# Patient Record
Sex: Female | Born: 1940 | Race: Black or African American | Hispanic: No | Smoking: Former smoker
Health system: Southern US, Community
[De-identification: ages and names within clinical notes are randomized; demographics above are authoritative.]

## PROBLEM LIST (undated history)

## (undated) DIAGNOSIS — R410 Disorientation, unspecified: Secondary | ICD-10-CM

## (undated) DIAGNOSIS — M549 Dorsalgia, unspecified: Secondary | ICD-10-CM

## (undated) DIAGNOSIS — R531 Weakness: Secondary | ICD-10-CM

## (undated) DIAGNOSIS — E785 Hyperlipidemia, unspecified: Secondary | ICD-10-CM

## (undated) DIAGNOSIS — G5600 Carpal tunnel syndrome, unspecified upper limb: Secondary | ICD-10-CM

## (undated) DIAGNOSIS — K219 Gastro-esophageal reflux disease without esophagitis: Secondary | ICD-10-CM

## (undated) DIAGNOSIS — G4700 Insomnia, unspecified: Secondary | ICD-10-CM

## (undated) DIAGNOSIS — F419 Anxiety disorder, unspecified: Secondary | ICD-10-CM

## (undated) DIAGNOSIS — M1990 Unspecified osteoarthritis, unspecified site: Secondary | ICD-10-CM

## (undated) DIAGNOSIS — R351 Nocturia: Secondary | ICD-10-CM

## (undated) DIAGNOSIS — R51 Headache: Secondary | ICD-10-CM

## (undated) DIAGNOSIS — Z8709 Personal history of other diseases of the respiratory system: Secondary | ICD-10-CM

## (undated) DIAGNOSIS — I671 Cerebral aneurysm, nonruptured: Secondary | ICD-10-CM

## (undated) DIAGNOSIS — I480 Paroxysmal atrial fibrillation: Secondary | ICD-10-CM

## (undated) DIAGNOSIS — M2540 Effusion, unspecified joint: Secondary | ICD-10-CM

## (undated) DIAGNOSIS — R519 Headache, unspecified: Secondary | ICD-10-CM

## (undated) DIAGNOSIS — M2550 Pain in unspecified joint: Secondary | ICD-10-CM

## (undated) DIAGNOSIS — I10 Essential (primary) hypertension: Secondary | ICD-10-CM

## (undated) DIAGNOSIS — H269 Unspecified cataract: Secondary | ICD-10-CM

## (undated) DIAGNOSIS — IMO0001 Reserved for inherently not codable concepts without codable children: Secondary | ICD-10-CM

## (undated) DIAGNOSIS — R350 Frequency of micturition: Secondary | ICD-10-CM

## (undated) HISTORY — PX: JOINT REPLACEMENT: SHX530

---

## 2005-10-04 HISTORY — PX: BRAIN SURGERY: SHX531

## 2006-08-06 ENCOUNTER — Inpatient Hospital Stay (HOSPITAL_COMMUNITY): Admission: EM | Admit: 2006-08-06 | Discharge: 2006-08-08 | Payer: Self-pay | Admitting: *Deleted

## 2006-08-08 ENCOUNTER — Encounter (INDEPENDENT_AMBULATORY_CARE_PROVIDER_SITE_OTHER): Payer: Self-pay | Admitting: Interventional Cardiology

## 2006-11-05 ENCOUNTER — Emergency Department (HOSPITAL_COMMUNITY): Admission: EM | Admit: 2006-11-05 | Discharge: 2006-11-06 | Payer: Self-pay | Admitting: Emergency Medicine

## 2006-12-20 ENCOUNTER — Encounter: Admission: RE | Admit: 2006-12-20 | Discharge: 2006-12-20 | Payer: Self-pay | Admitting: Internal Medicine

## 2006-12-22 ENCOUNTER — Encounter: Admission: RE | Admit: 2006-12-22 | Discharge: 2006-12-22 | Payer: Self-pay | Admitting: Internal Medicine

## 2007-04-19 ENCOUNTER — Encounter: Admission: RE | Admit: 2007-04-19 | Discharge: 2007-04-19 | Payer: Self-pay | Admitting: Internal Medicine

## 2007-11-07 ENCOUNTER — Emergency Department (HOSPITAL_COMMUNITY): Admission: EM | Admit: 2007-11-07 | Discharge: 2007-11-07 | Payer: Self-pay | Admitting: Emergency Medicine

## 2008-01-18 ENCOUNTER — Encounter: Admission: RE | Admit: 2008-01-18 | Discharge: 2008-01-18 | Payer: Self-pay | Admitting: Orthopedic Surgery

## 2008-01-19 ENCOUNTER — Inpatient Hospital Stay (HOSPITAL_COMMUNITY): Admission: RE | Admit: 2008-01-19 | Discharge: 2008-01-25 | Payer: Self-pay | Admitting: Orthopedic Surgery

## 2008-01-22 ENCOUNTER — Encounter (INDEPENDENT_AMBULATORY_CARE_PROVIDER_SITE_OTHER): Payer: Self-pay | Admitting: Orthopedic Surgery

## 2008-01-22 ENCOUNTER — Ambulatory Visit: Payer: Self-pay | Admitting: Vascular Surgery

## 2008-01-23 ENCOUNTER — Ambulatory Visit: Payer: Self-pay | Admitting: Physical Medicine & Rehabilitation

## 2008-05-07 ENCOUNTER — Encounter: Admission: RE | Admit: 2008-05-07 | Discharge: 2008-06-25 | Payer: Self-pay | Admitting: Orthopedic Surgery

## 2008-07-19 ENCOUNTER — Encounter: Admission: RE | Admit: 2008-07-19 | Discharge: 2008-07-19 | Payer: Self-pay | Admitting: Internal Medicine

## 2009-04-03 HISTORY — PX: TRACHEOSTOMY: SUR1362

## 2009-04-11 ENCOUNTER — Encounter (INDEPENDENT_AMBULATORY_CARE_PROVIDER_SITE_OTHER): Payer: Self-pay | Admitting: Neurosurgery

## 2009-04-11 ENCOUNTER — Inpatient Hospital Stay (HOSPITAL_COMMUNITY): Admission: AC | Admit: 2009-04-11 | Discharge: 2009-05-12 | Payer: Self-pay

## 2009-04-12 ENCOUNTER — Ambulatory Visit: Payer: Self-pay | Admitting: Internal Medicine

## 2009-04-22 ENCOUNTER — Encounter: Payer: Self-pay | Admitting: Gastroenterology

## 2009-04-25 ENCOUNTER — Ambulatory Visit: Payer: Self-pay | Admitting: Physical Medicine & Rehabilitation

## 2009-05-01 HISTORY — PX: VENTRICULOPERITONEAL SHUNT: SHX204

## 2009-05-02 ENCOUNTER — Ambulatory Visit: Payer: Self-pay | Admitting: Internal Medicine

## 2009-05-04 ENCOUNTER — Ambulatory Visit: Payer: Self-pay | Admitting: Pulmonary Disease

## 2009-05-12 ENCOUNTER — Encounter: Payer: Self-pay | Admitting: Family Medicine

## 2009-05-12 DIAGNOSIS — I69959 Hemiplegia and hemiparesis following unspecified cerebrovascular disease affecting unspecified side: Secondary | ICD-10-CM | POA: Insufficient documentation

## 2009-05-12 DIAGNOSIS — I10 Essential (primary) hypertension: Secondary | ICD-10-CM | POA: Insufficient documentation

## 2009-05-12 DIAGNOSIS — D649 Anemia, unspecified: Secondary | ICD-10-CM | POA: Insufficient documentation

## 2009-05-12 DIAGNOSIS — E785 Hyperlipidemia, unspecified: Secondary | ICD-10-CM | POA: Insufficient documentation

## 2009-05-12 DIAGNOSIS — I619 Nontraumatic intracerebral hemorrhage, unspecified: Secondary | ICD-10-CM | POA: Insufficient documentation

## 2009-05-12 DIAGNOSIS — M810 Age-related osteoporosis without current pathological fracture: Secondary | ICD-10-CM | POA: Insufficient documentation

## 2009-05-12 DIAGNOSIS — M1990 Unspecified osteoarthritis, unspecified site: Secondary | ICD-10-CM | POA: Insufficient documentation

## 2009-05-13 ENCOUNTER — Telehealth: Payer: Self-pay | Admitting: Family Medicine

## 2009-05-13 ENCOUNTER — Telehealth: Payer: Self-pay | Admitting: Gastroenterology

## 2009-05-14 ENCOUNTER — Ambulatory Visit: Payer: Self-pay | Admitting: Family Medicine

## 2009-05-15 ENCOUNTER — Encounter: Payer: Self-pay | Admitting: Gastroenterology

## 2009-05-15 ENCOUNTER — Ambulatory Visit (HOSPITAL_COMMUNITY): Admission: RE | Admit: 2009-05-15 | Discharge: 2009-05-15 | Payer: Self-pay | Admitting: Gastroenterology

## 2009-05-21 ENCOUNTER — Ambulatory Visit: Payer: Self-pay | Admitting: Gastroenterology

## 2009-05-23 ENCOUNTER — Encounter: Payer: Self-pay | Admitting: Family Medicine

## 2009-05-26 ENCOUNTER — Encounter: Payer: Self-pay | Admitting: Pharmacist

## 2009-05-29 ENCOUNTER — Ambulatory Visit: Payer: Self-pay | Admitting: Family Medicine

## 2009-05-29 ENCOUNTER — Telehealth: Payer: Self-pay | Admitting: Internal Medicine

## 2009-06-04 ENCOUNTER — Telehealth: Payer: Self-pay | Admitting: Family Medicine

## 2009-06-06 ENCOUNTER — Encounter: Payer: Self-pay | Admitting: Family Medicine

## 2009-06-12 ENCOUNTER — Ambulatory Visit (HOSPITAL_COMMUNITY): Admission: RE | Admit: 2009-06-12 | Discharge: 2009-06-12 | Payer: Self-pay | Admitting: Family Medicine

## 2009-06-12 ENCOUNTER — Telehealth (INDEPENDENT_AMBULATORY_CARE_PROVIDER_SITE_OTHER): Payer: Self-pay | Admitting: *Deleted

## 2009-06-18 ENCOUNTER — Encounter: Payer: Self-pay | Admitting: Family Medicine

## 2009-06-18 ENCOUNTER — Ambulatory Visit: Payer: Self-pay | Admitting: Family Medicine

## 2009-06-20 ENCOUNTER — Encounter: Payer: Self-pay | Admitting: Pharmacist

## 2009-07-09 ENCOUNTER — Ambulatory Visit: Payer: Self-pay | Admitting: Internal Medicine

## 2009-07-10 ENCOUNTER — Telehealth: Payer: Self-pay | Admitting: Internal Medicine

## 2009-07-11 ENCOUNTER — Ambulatory Visit: Payer: Self-pay | Admitting: Internal Medicine

## 2009-07-14 ENCOUNTER — Encounter: Admission: RE | Admit: 2009-07-14 | Discharge: 2009-07-14 | Payer: Self-pay | Admitting: Neurosurgery

## 2009-07-16 ENCOUNTER — Encounter: Payer: Self-pay | Admitting: Family Medicine

## 2009-07-16 ENCOUNTER — Ambulatory Visit: Payer: Self-pay | Admitting: Family Medicine

## 2009-07-17 ENCOUNTER — Encounter: Admission: RE | Admit: 2009-07-17 | Discharge: 2009-07-17 | Payer: Self-pay | Admitting: Neurosurgery

## 2009-07-18 ENCOUNTER — Inpatient Hospital Stay (HOSPITAL_COMMUNITY): Admission: RE | Admit: 2009-07-18 | Discharge: 2009-07-22 | Payer: Self-pay | Admitting: Neurosurgery

## 2009-07-22 ENCOUNTER — Telehealth: Payer: Self-pay | Admitting: Family Medicine

## 2009-07-23 ENCOUNTER — Encounter: Payer: Self-pay | Admitting: Family Medicine

## 2009-07-28 ENCOUNTER — Encounter: Payer: Self-pay | Admitting: Family Medicine

## 2009-07-28 LAB — CONVERTED CEMR LAB
BUN: 5 mg/dL
Chloride: 97 meq/L
Creatinine, Ser: 0.39 mg/dL

## 2009-07-29 ENCOUNTER — Emergency Department (HOSPITAL_COMMUNITY): Admission: EM | Admit: 2009-07-29 | Discharge: 2009-07-29 | Payer: Self-pay | Admitting: Emergency Medicine

## 2009-08-15 ENCOUNTER — Encounter: Payer: Self-pay | Admitting: Family Medicine

## 2009-08-16 ENCOUNTER — Telehealth: Payer: Self-pay | Admitting: Family Medicine

## 2009-08-18 ENCOUNTER — Ambulatory Visit: Payer: Self-pay | Admitting: Internal Medicine

## 2009-08-20 ENCOUNTER — Encounter: Payer: Self-pay | Admitting: Family Medicine

## 2009-08-20 LAB — CONVERTED CEMR LAB
Hemoglobin: 10.3 g/dL
MCV: 83 fL
TSH: 1.11 microintl units/mL
WBC: 4.3 10*3/uL

## 2009-08-21 LAB — CONVERTED CEMR LAB
Basophils Absolute: 0.2 10*3/uL — ABNORMAL HIGH (ref 0.0–0.1)
Eosinophils Absolute: 0.1 10*3/uL (ref 0.0–0.7)
HCT: 35 % — ABNORMAL LOW (ref 36.0–46.0)
Lymphs Abs: 1.6 10*3/uL (ref 0.7–4.0)
MCHC: 33.2 g/dL (ref 30.0–36.0)
Monocytes Absolute: 0.5 10*3/uL (ref 0.1–1.0)
Monocytes Relative: 11.3 % (ref 3.0–12.0)
Platelets: 228 10*3/uL (ref 150.0–400.0)
RDW: 17.6 % — ABNORMAL HIGH (ref 11.5–14.6)
TSH: 1.17 microintl units/mL (ref 0.35–5.50)

## 2009-08-26 ENCOUNTER — Ambulatory Visit (HOSPITAL_COMMUNITY): Admission: RE | Admit: 2009-08-26 | Discharge: 2009-08-26 | Payer: Self-pay | Admitting: Pediatrics

## 2009-08-27 ENCOUNTER — Encounter: Payer: Self-pay | Admitting: Pharmacist

## 2009-08-27 ENCOUNTER — Telehealth: Payer: Self-pay | Admitting: Internal Medicine

## 2009-09-03 ENCOUNTER — Encounter: Payer: Self-pay | Admitting: Family Medicine

## 2009-09-10 ENCOUNTER — Encounter: Payer: Self-pay | Admitting: Pharmacist

## 2009-09-11 ENCOUNTER — Ambulatory Visit (HOSPITAL_COMMUNITY): Admission: RE | Admit: 2009-09-11 | Discharge: 2009-09-11 | Payer: Self-pay | Admitting: Pediatrics

## 2009-09-24 ENCOUNTER — Encounter (INDEPENDENT_AMBULATORY_CARE_PROVIDER_SITE_OTHER): Payer: Self-pay | Admitting: *Deleted

## 2009-09-30 ENCOUNTER — Encounter: Admission: RE | Admit: 2009-09-30 | Discharge: 2009-09-30 | Payer: Self-pay | Admitting: Neurosurgery

## 2009-10-08 ENCOUNTER — Encounter: Payer: Self-pay | Admitting: Family Medicine

## 2009-10-10 ENCOUNTER — Encounter: Payer: Self-pay | Admitting: Family Medicine

## 2009-10-10 LAB — CONVERTED CEMR LAB
BUN: 9 mg/dL
CO2: 21 meq/L
Calcium: 8.9 mg/dL
Creatinine, Ser: 0.41 mg/dL
Glucose, Bld: 84 mg/dL
Triglycerides: 189 mg/dL

## 2009-10-17 ENCOUNTER — Encounter: Payer: Self-pay | Admitting: Family Medicine

## 2009-10-21 ENCOUNTER — Encounter: Payer: Self-pay | Admitting: Family Medicine

## 2009-10-21 ENCOUNTER — Telehealth: Payer: Self-pay | Admitting: *Deleted

## 2009-10-22 ENCOUNTER — Encounter: Payer: Self-pay | Admitting: Pharmacist

## 2009-10-27 ENCOUNTER — Ambulatory Visit: Payer: Self-pay | Admitting: Gastroenterology

## 2009-10-29 ENCOUNTER — Encounter: Payer: Self-pay | Admitting: Pharmacist

## 2009-11-05 ENCOUNTER — Encounter: Payer: Self-pay | Admitting: Pharmacist

## 2009-11-11 ENCOUNTER — Telehealth: Payer: Self-pay | Admitting: Family Medicine

## 2009-11-12 ENCOUNTER — Encounter: Payer: Self-pay | Admitting: Family Medicine

## 2009-11-12 ENCOUNTER — Ambulatory Visit: Payer: Self-pay | Admitting: Family Medicine

## 2009-11-20 ENCOUNTER — Telehealth: Payer: Self-pay | Admitting: Family Medicine

## 2009-11-27 ENCOUNTER — Encounter: Payer: Self-pay | Admitting: Family Medicine

## 2009-12-03 ENCOUNTER — Encounter: Payer: Self-pay | Admitting: Family Medicine

## 2009-12-04 ENCOUNTER — Encounter: Payer: Self-pay | Admitting: Family Medicine

## 2009-12-09 ENCOUNTER — Encounter: Payer: Self-pay | Admitting: Family Medicine

## 2009-12-15 ENCOUNTER — Telehealth: Payer: Self-pay | Admitting: *Deleted

## 2009-12-15 ENCOUNTER — Encounter: Payer: Self-pay | Admitting: Family Medicine

## 2009-12-26 ENCOUNTER — Ambulatory Visit: Payer: Self-pay | Admitting: Family Medicine

## 2010-08-03 IMAGING — CT CT CHEST W/O CM
2 of 3 series · 15 of 36 positions shown, 18 images · non-contrast
Comparison: 01/23/2008 and 08/05/2006..

CLINICAL DATA: Follow-up pulmonary nodules.  Ex-smoker.

CT CHEST WITHOUT CONTRAST
TECHNIQUE: Multidetector CT imaging of the chest was performed
following the standard protocol without IV contrast.

[Series 3: routine chest · axial · 0.70mm/px · z∈[-286,-56]mm · 12 of 54 slices shown, 15 images]
[im 4/54  mediastinal]
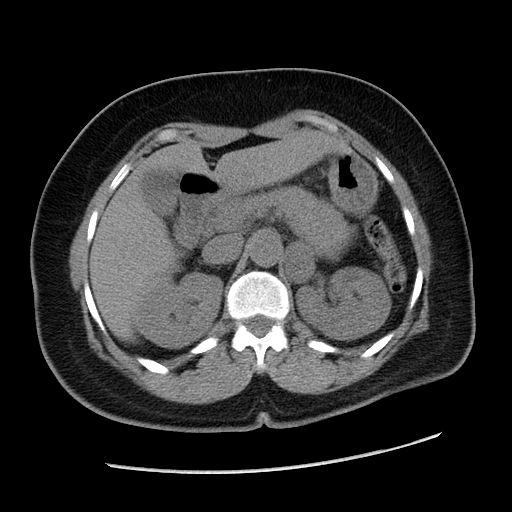
[im 4/54  lung]
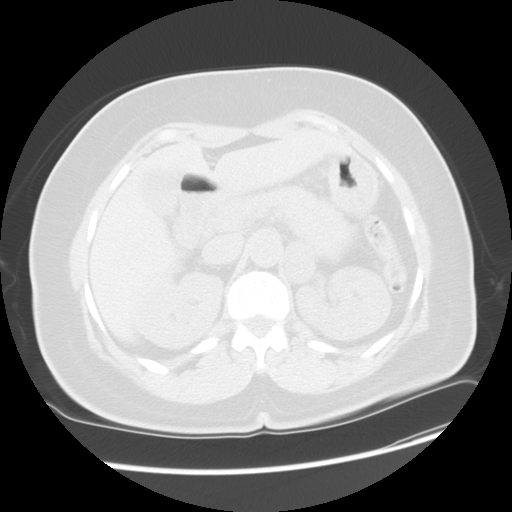
[im 8/54  lung]
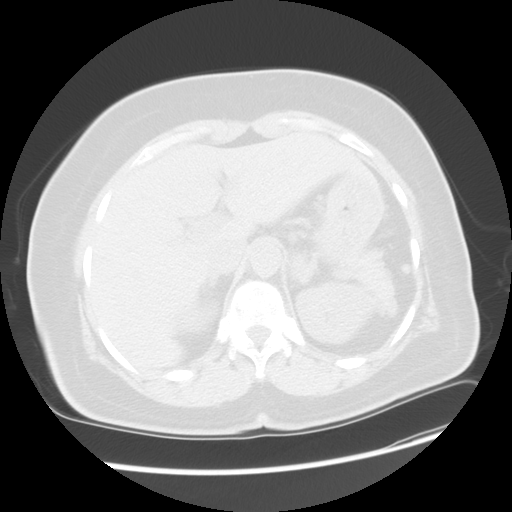
[im 12/54  lung]
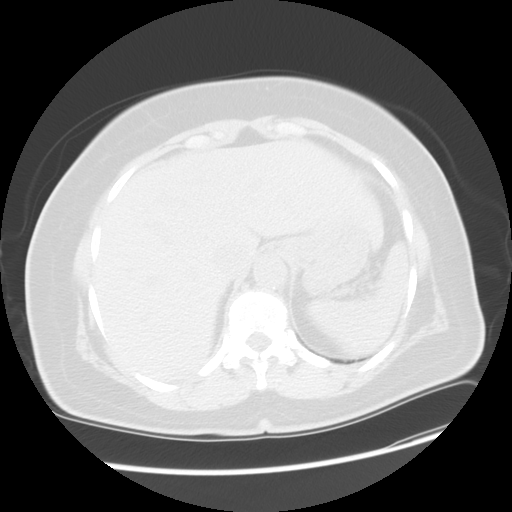
[im 16/54  lung]
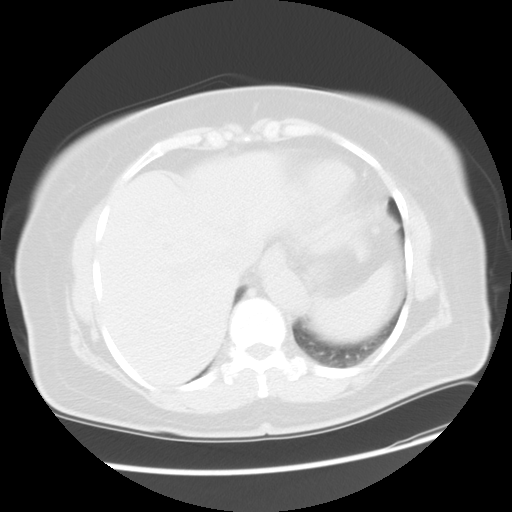
[im 20/54  mediastinal]
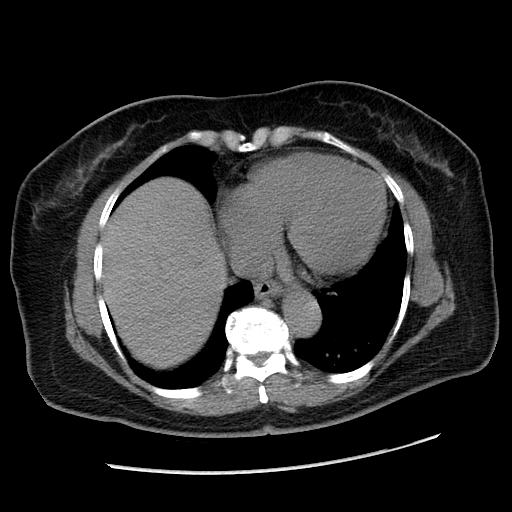
[im 20/54  lung]
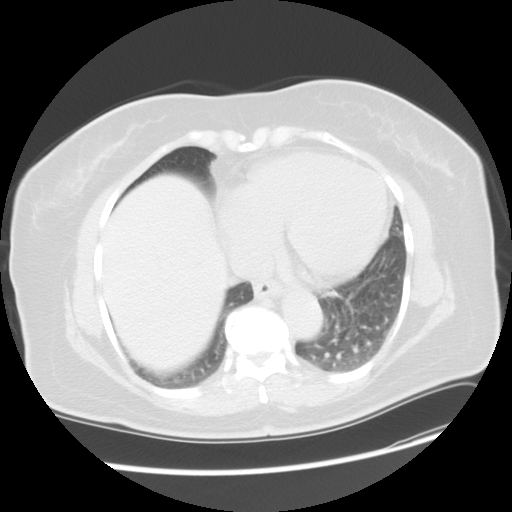
[im 24/54  lung]
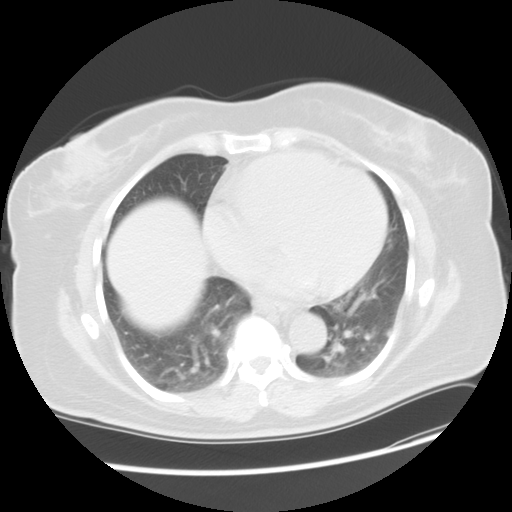
[im 30/54  lung]
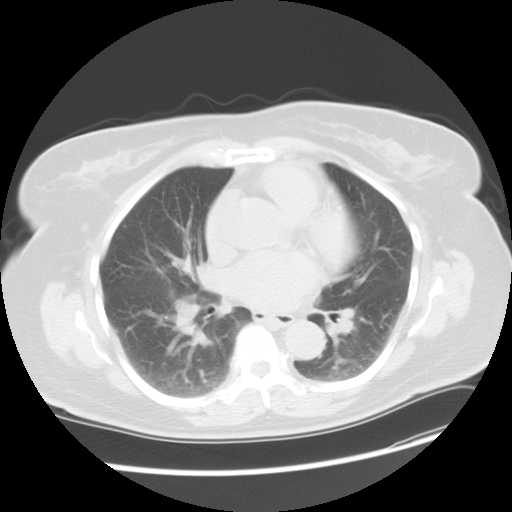
[im 34/54  lung]
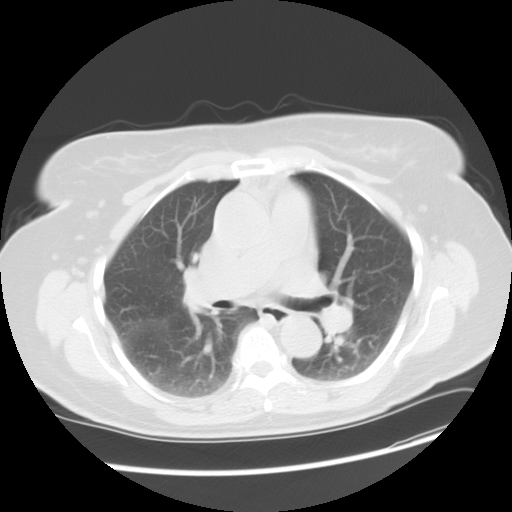
[im 38/54  mediastinal]
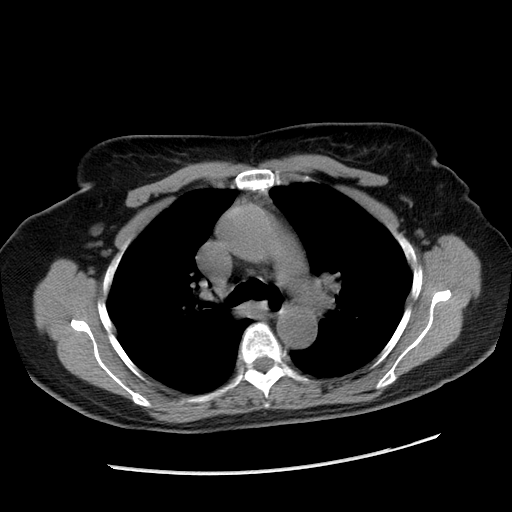
[im 38/54  lung]
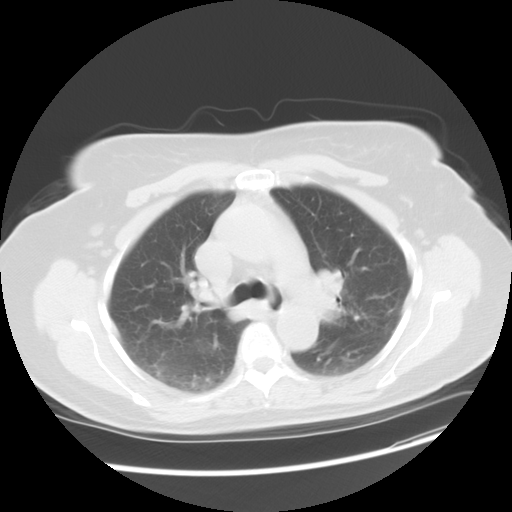
[im 42/54  lung]
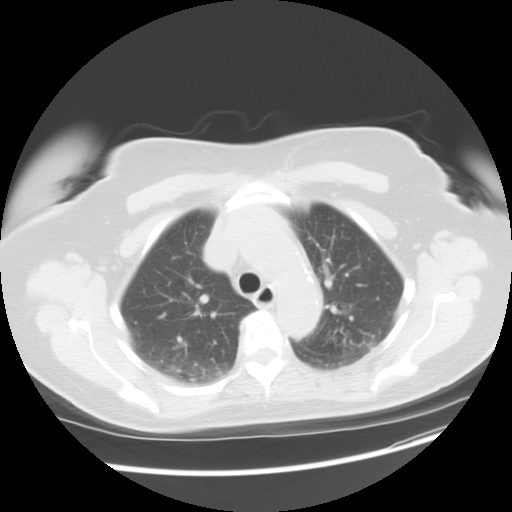
[im 46/54  lung]
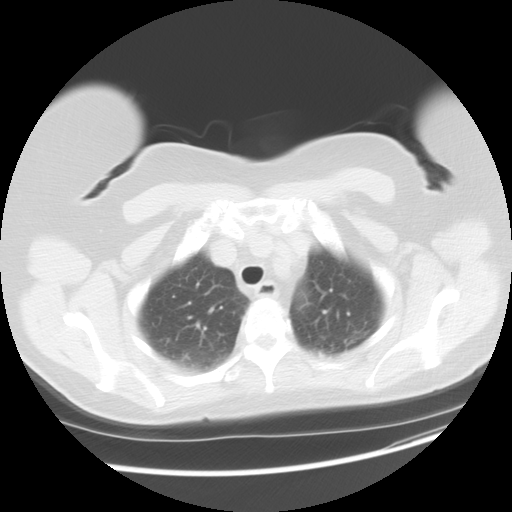
[im 50/54  lung]
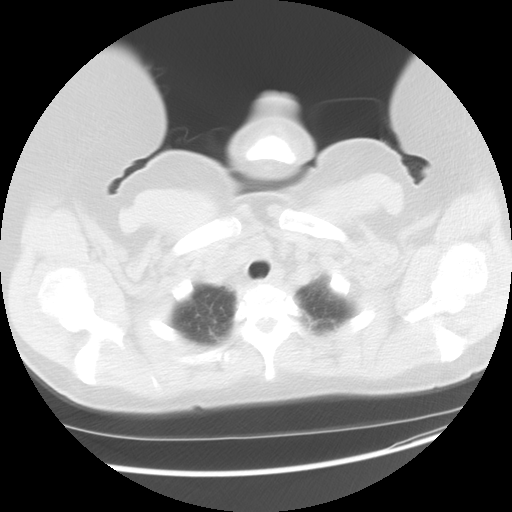

[Series 602: sagittal body · sagittal · 0.70mm/px · 3 of 145 slices shown]
[im 29/145  lung]
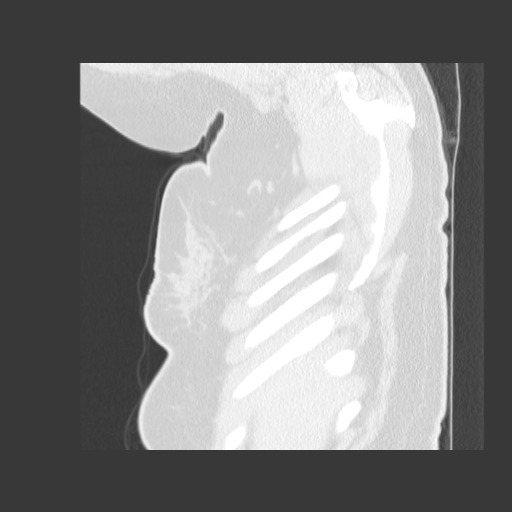
[im 58/145  lung]
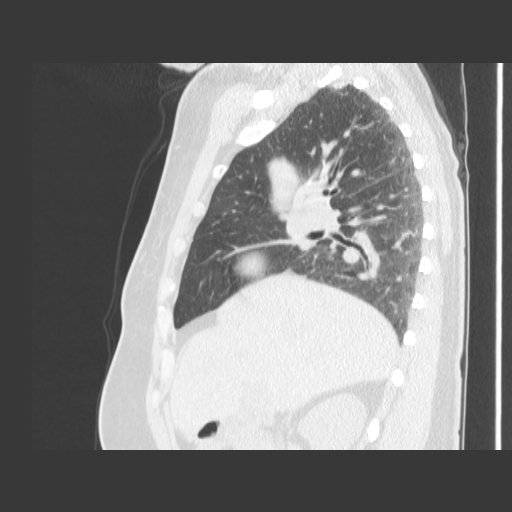
[im 87/145  lung]
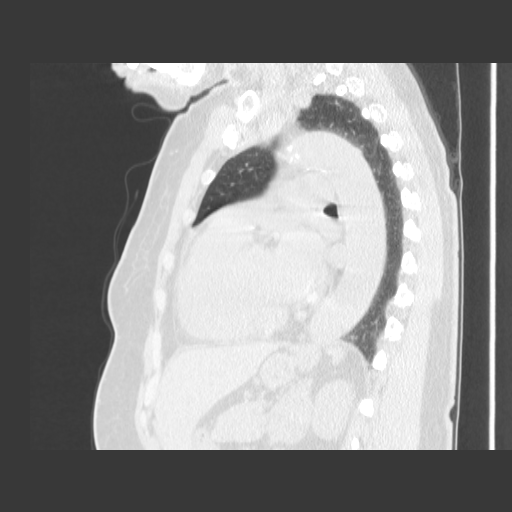

[15 of 36 positions shown; findings below may reference images not displayed]

FINDINGS: There is scattered tiny pulmonary nodules which appear
the same or slightly smaller than on the prior exam.  This
includes:  Right middle lobe 3 mm nodule (series 4 image 25),
mm pleural based nodule right lung base (series 4 image 26), 5.5 mm
partially calcified pleural-based right middle lobe nodule (series
4 image 30), 3.8 mm pleural based nodule left lung base (series 4
image 32) and tiny nodules side-by-side inferior aspect left lung
base (series 4 image 38).  One more follow-up CT scan without
contrast in 12 months recommended to confirm stability.

Similar appearance of subcarinal 1.6 x 1 cm lymph node.  No
significant change what appears to be a left adrenal myelolipoma
measuring 3.1 x 2.6 cm.  Ectasia and calcifications thoracic aorta
with ascending thoracic aorta appears slightly prominent with
maximal transverse dimension of 3.7 cm.  Heart size top normal to
minimally enlarged.
IMPRESSION: Pulmonary nodules appear the same or slightly smaller than on the
prior exam.  This slight difference may be related to the fact that
thin section imaging was utilized on prior exam (pulmonary embolus
technique).  One can help confirm stability with one more follow-up
chest CT in 12 months.

Atherosclerotic type changes aorta with mild dilation ascending
aorta with maximal transverse dimension 3.7 cm appears unchanged.

No significant change left adrenal lesion which may represent a
myelolipoma.

No significant change top normal size subcarinal lymph node.

## 2010-09-24 ENCOUNTER — Encounter: Payer: Self-pay | Admitting: Family Medicine

## 2010-11-03 NOTE — Letter (Signed)
Summary: Generic Letter  [facility]  [address]   [place], [place] [address]   Phone: [phone]  Fax: [phone]    12/15/2009  Sharica Oettinger [address] [place], [place]  [address]  Dear Ms. Maisie Fus,  To whom it may concern,   I am one of the providers who took care of Mrs Deepti Gunawan while she was at [facility].  She is competent to make decisions concerning her care.  For her psychological testing and psychiatric evaluation, please contact [facility] at [phone].  Please feel free to contact my office if you have any questions.   Sincerely,   Eustaquio Boyden  MD

## 2010-11-03 NOTE — Assessment & Plan Note (Signed)
Summary: DISCUSS REMOVING PEG TUBE/YF   History of Present Illness Visit Type: consult  Primary GI MD: Melvia Heaps MD [facility] Primary Provider: Zachery Dauer, MD  Requesting Provider: Aliene Beams, MD  Chief Complaint: Discuss removing peg tube  History of Present Illness:   Mr. Banbury has returned to have her gastrostomy tube removed.  She is eating adequate amounts orally and no longer uses the 2.  The gastrostomy tube was placed in August, 2010.   GI Review of Systems      Denies abdominal pain, acid reflux, belching, bloating, chest pain, dysphagia with liquids, dysphagia with solids, heartburn, loss of appetite, nausea, vomiting, vomiting blood, weight loss, and  weight gain.        Denies anal fissure, black tarry stools, change in bowel habit, constipation, diarrhea, diverticulosis, fecal incontinence, heme positive stool, hemorrhoids, irritable bowel syndrome, jaundice, light color stool, liver problems, rectal bleeding, and  rectal pain.    Current Medications (verified): 1)  Norvasc 10 Mg Tabs (Amlodipine Besylate) .... One Daily 2)  Protonix 40 Mg Tbec (Pantoprazole Sodium) .... Once Daily 3)  Keppra 500 Mg Tabs (Levetiracetam) .... One Per Gtube Two Times A Day 4)  Colace 100 Mg Caps (Docusate Sodium) .... One By Mouth Two Times A Day Per Gtube, Hold For Loose Stools 5)  Free Water Flushes .... 240cc Per Gt Q4 Hours 6)  Ssi .... Per Protocol, Sensitive, With Cbgs Qac Hs 7)  Doxazosin Mesylate 1 Mg Tabs (Doxazosin Mesylate) .... 2 Tabs Daily 8)  Lorazepam 2 Mg/ml Soln (Lorazepam) .... 1mg  [place]  As Needed Seizures 9)  Celexa 20 Mg Tabs (Citalopram Hydrobromide) .... One Tab By Mouth Daily 10)  Sodium Chloride 1 Gm Tabs (Sodium Chloride) .... Two Daily 11)  Naprosyn 500 Mg Tabs (Naproxen) .... Take One By Mouth Two Times A Day As Needed Knee Pain 12)  Klor-Con 20 Meq Pack (Potassium Chloride) .... Once Daily 13)  Hydrocodone-Acetaminophen 7.5-500 Mg Tabs  (Hydrocodone-Acetaminophen) .Marland Kitchen.. 1 Q4 Prn 14)  Celexa 10 Mg Tabs (Citalopram Hydrobromide) .... One Tablet By Mouth Once Daily  Allergies (verified): No Known Drug Allergies  Past History:  Past Medical History: Reviewed history from 06/06/2009 and no changes required. 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 Craniotomy with hematoma evacuation and AVM resection 04/11/2009 Tracheostomy 04/18/2009 PEG placement 04/2009 VP Shunt placement 05/01/2009 VP shunt revision October 2010 L total knee replacement 2009  Family History: Reviewed history from 05/12/2009 and no changes required. Mother - DM Father - Prosate CA 1 sister with brain aneurysm HTN in family  Social History: Reviewed history from 10/08/2009 and no changes required. Currently resident at [facility].  This patient lives in the [place], [place], area.  She is not married, has 3 children who are alive and well.  The patient has not worked.  No EtOH, smoking.  Daughter Fynlee Rowlands.  Review of Systems  The patient denies allergy/sinus, anemia, anxiety-new, arthritis/joint pain, back pain, blood in urine, breast changes/lumps, change in vision, confusion, cough, coughing up blood, depression-new, fainting, fatigue, fever, headaches-new, hearing problems, heart murmur, heart rhythm changes, itching, menstrual pain, muscle pains/cramps, night sweats, nosebleeds, pregnancy symptoms, shortness of breath, skin rash, sleeping problems, sore throat, swelling of feet/legs, swollen lymph glands, thirst - excessive ,  urination - excessive , urination changes/pain, urine leakage, vision changes, and voice change.    Vital  Signs:  Patient profile:   70 year old female Height:      68 inches Pulse rate:   88 / minute Pulse rhythm:   regular BP sitting:   100 / 68  (left arm) Cuff size:   regular  Vitals Entered By: Ok Anis CMA (October 27, 2009 1:27 PM)  Physical Exam  Additional Exam:  On exam she is an elderly female  The gastrostomy tube was pulled percutaneously.   Impression & Recommendations:  Problem # 1:  GASTROSTOMY STATUS (ICD-V44.1) Patient's gastrostomy tube was removed percutaneously.  Oral intake is adequate.  Weight was not documented today as patient unable to stand; BMI could not be calculated.

## 2010-11-03 NOTE — Miscellaneous (Signed)
  Clinical Lists Changes  Medications: Removed medication of DOXAZOSIN MESYLATE 2 MG TABS (DOXAZOSIN MESYLATE) once daily Removed medication of KLOR-CON 20 MEQ PACK (POTASSIUM CHLORIDE) once daily Changed medication from COLACE 100 MG CAPS (DOCUSATE SODIUM) one by mouth two times a day per GTube, hold for loose stools to COLACE 100 MG CAPS (DOCUSATE SODIUM) one by mouth two times a day, hold for loose stools Changed medication from ULTRACET 37.5-325 MG TABS (TRAMADOL-ACETAMINOPHEN) 30 min prior to PT and afternoon and hs to ULTRACET 37.5-325 MG TABS (TRAMADOL-ACETAMINOPHEN) 2 tabs po 30 min prior to PT and 1 tab po bid Changed medication from KEPPRA 500 MG TABS (LEVETIRACETAM) one per Gtube two times a day to KEPPRA 500 MG TABS (LEVETIRACETAM) one po two times a day

## 2010-11-03 NOTE — Assessment & Plan Note (Signed)
Summary: for FL-2 form,tcb   Vital Signs:  Patient profile:   70 year old female Height:      68 inches Temp:     97.5 degrees F oral Pulse rate:   73 / minute BP sitting:   133 / 85  (left arm) Cuff size:   regular  Vitals Entered By: Gina Parker CMA (December 26, 2009 2:57 PM)  Primary Care Provider:  Zachery Dauer, MD    History of Present Illness: CC: ? fill out FL2  Previously PCP was Dr. Nehemiah Parker at [facility] with Gina Parker, family friend.  Here to discuss placement.  Living with brother at [facility].  Gina Parker is unable to walk or take care of herself from the waist down, brother unable to.  Needs more help.  Filling out FL2 today - has had 3-4 falls since at home.  Incontinence with bowel and bladder.  Still having R and L knee pain.  (h/o L knee replacement).  Previously on ultracet, then d/c and changed to Tylenol.  On Keppra after AVM rupture and VP shunt placement, but no documentation of ever having seizure during hospitalization.  Also per patient and Gina Parker, has bed sore since went home (noticed 2 saturdays ago).  Gina Parker says that bed sore was there and treated since [facility] (3/8).  Appetite not what it should be, having difficulty sleeping.  Notes having difficulty remembering things.  Current Medications (verified): 1)  Norvasc 10 Mg Tabs (Amlodipine Besylate) .... One Daily 2)  Protonix 40 Mg Tbec (Pantoprazole Sodium) .... Once Daily 3)  Keppra 500 Mg Tabs (Levetiracetam) .... One Po Two Times A Day 4)  Colace 100 Mg Caps (Docusate Sodium) .... One By Mouth Two Times A Day, Hold For Loose Stools 5)  Celexa 20 Mg Tabs (Citalopram Hydrobromide) .... One Tab By Mouth Daily 6)  Minerin  Crea (Skin Protectants, Misc.) .... Apply To Lower Extremites Twice Daily 7)  Ultracet 37.5-325 Mg Tabs (Tramadol-Acetaminophen) .... Take One To Two Two Times A Day As Needed Pain 8)  Aricept 5 Mg Tabs (Donepezil Hcl) .... Take One By Mouth Daily  Allergies  (verified): No Known Drug Allergies  Past History:  Past medical, surgical, family and social histories (including risk factors) reviewed for relevance to current acute and chronic problems.  Past Medical History: Reviewed history from 12/04/2009 and no changes required. 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 Craniotomy with hematoma evacuation and AVM resection 04/11/2009 Tracheostomy 04/18/2009 decannulated 09/2009 PEG placement 04/2009 removal 10/2009 VP Shunt placement 05/01/2009 VP shunt revision October 2010 L total knee replacement 2009  Family History: Reviewed history from 05/12/2009 and no changes required. Mother - DM Father - Prosate CA 1 sister with brain aneurysm HTN in family  Social History: Reviewed history from 12/04/2009 and no changes required. Currently resident at [facility].  This patient lives in the [place], area.  She is not married, has 3 children who are alive and well.  The patient has not worked.  No EtOH, smoking.  Daughter Gina Parker.  plans to leave nursing home 12/09/2009  Physical Exam  General:  Alert, interactive and appropriate.   Lungs:  Normal respiratory effort, chest expands symmetrically. Lungs are clear to auscultation, no crackles or  wheezes. Heart:  Normal rate and regular rhythm. S1 and S2 normal without gallop, murmur, click, rub or other extra sounds. Abdomen:  soft, non tender, no masses, no guarding, no distention. 6 cm horizontal lower abdomen surg scar Skin:   ~3 cm x 3cm hypopigmented post inflammatory macule left buttock, no break in skin.   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  evidence of dementia.  Have started Aricept, discussed common side effects to include gi upset.  Needs more help than brother able to provide at home.  have filled out FL2 form for placement.  Wil likely need SNF care and assistance with personal care.  Problem # 2:  HYPERTENSION (ICD-401.9) controlled on norvasc. Her updated medication list for this problem includes:    Norvasc 10 Mg Tabs (Amlodipine besylate) ..... One daily  BP today: 133/85 Prior BP: 114/68 (12/04/2009)  Labs Reviewed: K+: 3.8 (10/10/2009) Creat: : 0.41 (10/10/2009)   HDL: 43 (10/10/2009)   LDL: 107 (10/10/2009)   TG: 189 (10/10/2009)  Problem # 3:  DEGENERATIVE JOINT DISEASE (ICD-715.90)  Her updated medication list for this problem includes:    Ultracet 37.5-325 Mg Tabs (Tramadol-acetaminophen) .Marland Kitchen... Take one to two two times a day as needed pain  Discussed use of medications, application of heat or cold, and exercises.  restarted ultracet.  doubt patient has seizures, could consider d/c keppra.  Problem # 4:  Hx of ARTERIOVENOUS MALFORMATION S/P RESECTION (ICD-747.60)  Complete Medication List: 1)  Norvasc 10 Mg Tabs (Amlodipine besylate) .... One daily 2)  Protonix 40 Mg Tbec (Pantoprazole sodium) .... Once daily 3)  Keppra 500 Mg Tabs (Levetiracetam) .... One po two times a day 4)  Colace 100 Mg Caps (Docusate sodium) .... One by mouth two times a day, hold for loose stools 5)  Celexa 20 Mg Tabs (Citalopram hydrobromide) .... One tab by mouth daily 6)  Minerin Crea (Skin protectants, misc.) .... Apply to lower extremites twice daily 7)  Ultracet 37.5-325 Mg Tabs (Tramadol-acetaminophen) .... Take one to two two times a day as needed pain 8)  Aricept 5 Mg Tabs (Donepezil hcl) .... Take one by mouth daily  Patient Instructions: 1)  Please return in 1 1/2 wks. 2)  We will start 2 medicines today: Aricept daily 5mg , Ultracet twice daily as needed for pain.  (Stop tylenol). 3)   I have filled out FL-2 form.  Let me know how we are doing with FL2 and placement. Prescriptions: ARICEPT 5 MG TABS (DONEPEZIL HCL) take one by mouth daily  #30 x 1   Entered and Authorized by:   Eustaquio Boyden  MD   Signed by:   Eustaquio Boyden  MD on 12/26/2009   Method used:   Electronically to        [facility]  [address] [phone]* (retail)       [address] [place] [address]       [place], [place]  [address]       Ph: [phone] or [phone]       Fax: [phone]   RxID:   [id number] ULTRACET 37.5-325 MG TABS (TRAMADOL-ACETAMINOPHEN) take one to two two times a day as needed pain  #45 x 0   Entered and Authorized by:   Eustaquio Boyden  MD   Signed by:   Eustaquio Boyden  MD on 12/26/2009   Method used:   Electronically to  [facility]  [address] [phone]* (retail)       [address] [place]       [place], [place]  [address]       Ph: [phone] or [phone]       Fax: [phone]   RxID:   [id number]      Geriatric Assessment:  Activities of Daily Living:    Bathing-dependent    Dressing-dependent    Eating-independent    Toileting-dependent    Transferring-dependent    Continence-dependent Overall Assessment: dependent  Instrumental Activities of Daily Living:    Transportation-dependent    Meal/Food Preparation-dependent    Shopping Errands-dependent    Housekeeping/Chores-dependent    Money Management/Finances-dependent    Medication Management-assisted    Ability to Use Telephone-assisted    Laundry-dependent Overall Assessment: dependent  Mental Status Exam: (value/max value)    Orientation to Time: 3/5    Orientation to Place: 5/5    Registration: 3/3    Attention/Calculation: 4/5    Recall: 1/3    Language-name 2 objects: 2/2    Language-repeat: 0/1    Language-follow 3-step command: 3/3    Language-read and follow direction: 1/1    Write a sentence: 1/1    Copy design: 0/1 MSE Total score:  23/30    Prevention & Chronic Care Immunizations   Influenza vaccine: Fluvax 3+  (07/09/2009)   Influenza vaccine due: 06/04/2010    Tetanus booster: Not documented    Pneumococcal vaccine: given  (05/20/2009)   Pneumococcal vaccine deferral: Not indicated  (12/26/2009)    H. zoster vaccine: Not documented  Colorectal Screening   Hemoccult: Not documented    Colonoscopy: Not documented  Other Screening   Pap smear: Not documented   Pap smear action/deferral: Not indicated-other  (08/15/2009)    Mammogram: Not documented    DXA bone density scan: Not documented   DXA bone density action/deferral: Not indicated  (08/15/2009)   Smoking status: never  (10/08/2009)  Lipids   Total Cholesterol: Not documented   LDL: 107  (10/10/2009)   LDL Direct: Not documented   HDL: 43  (10/10/2009)   Triglycerides: 189  (10/10/2009)    SGOT (AST): Not documented   SGPT (ALT): Not documented   Alkaline phosphatase: Not documented   Total bilirubin: Not documented    Lipid flowsheet reviewed?: Yes   Progress toward LDL goal: Unchanged  Hypertension   Last Blood Pressure: 133 / 85  (12/26/2009)   Serum creatinine: 0.41  (10/10/2009)   Serum potassium 3.8  (10/10/2009)    Hypertension flowsheet reviewed?: Yes   Progress toward BP goal: At goal  Self-Management Support :   Personal Goals (by the next clinic visit) :      Personal blood pressure goal: 130/80  (07/23/2009)     Personal LDL goal: 100  (08/15/2009)    Hypertension self-management support: Not documented    Hypertension self-management support not done because: Good outcomes  (12/05/2009)    Lipid self-management support: Not documented     Appended Document: Orders Update    Clinical Lists Changes  Orders: Added new Test order of [facility]  Level 4 ([address]644) - Signed

## 2010-11-03 NOTE — Progress Notes (Signed)
Summary: phn msg  Phone Note Call from Patient Call back at Home Phone [phone]   Caller: Daughter-Lisa Maisie Fus Summary of Call: Dr Sheffield Slider is supposed to write a letter stating that she will be at [facility] for Short stay-  [address] [place], [place] [address] Initial call taken by: De Nurse,  November 20, 2009 2:37 PM  Follow-up for Phone Call        That's the letter that is already  in the documents and was sent to Admin to be printed Follow-up by: Zachery Dauer MD,  November 20, 2009 2:49 PM

## 2010-11-03 NOTE — Miscellaneous (Signed)
Summary: Tube feeding discontinued  Clinical Lists Changes  Observations: Added new observation of LDL: 107 mg/dL (29/56/2130 86:57) Added new observation of HDL: 43 mg/dL (84/69/6295 28:41) Added new observation of TRIGLYC TOT: 189 mg/dL (32/44/0102 72:53) Added new observation of CALCIUM: 8.9 mg/dL (66/44/0347 42:59) Added new observation of CREATININE: 0.41 mg/dL (56/38/7564 33:29) Added new observation of BUN: 9 mg/dL (51/88/4166 06:30) Added new observation of BG RANDOM: 84 mg/dL (16/10/930 35:57) Added new observation of CO2 PLSM/SER: 21 meq/L (10/10/2009 10:20) Added new observation of CL SERUM: 105 meq/L (10/10/2009 10:20) Added new observation of K SERUM: 3.8 meq/L (10/10/2009 10:20) Added new observation of NA: 142 meq/L (10/10/2009 10:20)

## 2010-11-03 NOTE — Miscellaneous (Signed)
Summary: PCP VISIT/Discharge Summary   Vital Signs:  Patient profile:   70 year old female Temp:     97.4 degrees F Pulse rate:   70 / minute Resp:     20 per minute BP supine:   114 / 68  Primary Care Provider:  Zachery Dauer, MD    History of Present Illness: CC: nursing home visit.  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  Pt with PEG Gina Parker) removed 10/2009 and Trache (Gina Parker) decannulated 09/2009.  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    Gina Parker is doing great today.  She continues to endorse knee pain bilaterally, but L > R (L side is where she had knee replacement surgery last year) worse when participating in PT, has been using ultracet prior to PT with good relief.  Xray report with evidence of OA bilaterally.    Currently on celexa 20mg  for depression and doing well on this.  Also on Keppra after seizure x 1 from event.  Current Medications (verified): 1)  Norvasc 10 Mg Tabs (Amlodipine Besylate) .... One Daily 2)  Protonix 40 Mg Tbec (Pantoprazole Sodium) .... Once Daily 3)  Keppra 500 Mg Tabs (Levetiracetam) .... One Po Two Times A Day 4)  Colace 100 Mg Caps (Docusate Sodium) .... One By Mouth Two Times A Day, Hold For Loose Stools 5)  Lorazepam 2 Mg/ml Soln (Lorazepam) .... 1mg  [place]  As Needed Seizures 6)  Celexa 20 Mg Tabs (Citalopram Hydrobromide) .... One Tab By Mouth Daily 7)  Minerin  Crea (Skin Protectants, Misc.) .... Apply To Lower Extremites Twice Daily 8)  Ultracet 37.5-325 Mg Tabs (Tramadol-Acetaminophen) .... 2 Tabs Po 30 Min Prior To Pt and 1 Tab Po Bid  Allergies (verified): No Known Drug Allergies  Past History:  Past medical, surgical, family and social histories (including risk factors) reviewed for relevance to current acute and chronic problems.  Past Medical History: Proteus UTI  sensitive to Keflex (05/2009) with urinary retention L frontal hemorrhage from AVM s/p craniotomy and resection Communicating Hydrocephalus s/p VP shunt h/o trache and PEG Anemia Hyponatremia HTN HLD OA Osteoporsis Obesity  Past Surgical History: Craniotomy with hematoma evacuation and AVM resection 04/11/2009 Tracheostomy 04/18/2009 decannulated 09/2009 PEG placement 04/2009 removal 10/2009 VP Shunt placement 05/01/2009 VP shunt revision October 2010 L total knee replacement 2009  Family History: Reviewed history from 05/12/2009 and no changes required. Mother - DM Father - Prosate CA 1 sister with brain aneurysm HTN in family  Social History: Reviewed history from 10/08/2009 and no changes required. Currently resident at [facility].  This patient lives in the [place], [place], area.  She is not married, has 3 children who are alive and well.  The patient has not worked.  No EtOH, smoking.  Daughter Gina Parker.  plans to leave nursing home 12/09/2009  Physical Exam  General:  Alert, interactive and appropriate.   Head:  left frontoparietal surgical wound healed.  left parietal wound healing well with underlying tubular structure.  hair has regrown well Lungs:  Normal respiratory effort, chest expands symmetrically. Lungs are clear to auscultation, no crackles or wheezes. Heart:  Normal rate and regular rhythm. S1 and S2 normal without gallop, murmur, click, rub or other extra sounds. Abdomen:  soft, non tender, no masses, no guarding, no distention. 6 cm horizontal lower abdomen surg scar without  signs of infection. Msk:  vertical well healed surgical scar left anterior knee Pulses:  2+ periph pulses Extremities:  1+ left pedal edema and 1+ right pedal edema.     Impression & Recommendations:  Problem # 1:  ANEMIA (ICD-285.9)  Hgb slowly increasing.  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  have asked CM to help clarify discharge planning (ie where Gina Parker will be living, she is currently unsure about this - says either with brother in [place] or with sister in [place]).  regardless, will need assistance with transfers as remains non-ambulatory.  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  (Dr. Anne Parker started AED upon admission to hospital, pt and family deny sz during hospialization or previously).  I wonder if she could be taken off Keppra as has not had any more seizure activity since being placed on this.  Problem # 4:  HYPERTENSION (ICD-401.9) stable only on norvasc.  could consider titrating down to 5mg  to see if remains controlled ( have take her off other antihypertensives). Her updated medication list for this problem includes:    Norvasc 10 Mg Tabs (Amlodipine besylate) ..... One daily  Problem # 5:  DEGENERATIVE JOINT DISEASE (ICD-715.90)  continue ultracet.   The following medications were removed from the medication list:    Naprosyn 500 Mg Tabs (Naproxen) .Marland Kitchen... Take one by mouth two times a day as needed knee pain Her updated medication list for this problem includes:    Ultracet 37.5-325 Mg Tabs (Tramadol-acetaminophen) .Marland Kitchen... 2 tabs po 30 min prior to pt and 1 tab po bid  Complete Medication List: 1)  Norvasc 10 Mg Tabs (Amlodipine besylate) .... One daily 2)  Protonix 40 Mg Tbec (Pantoprazole sodium) .... Once daily 3)  Keppra 500 Mg Tabs (Levetiracetam) .... One po two times a day 4)  Colace 100 Mg Caps (Docusate sodium) .... One by mouth two times a day, hold for loose stools 5)  Lorazepam 2 Mg/ml Soln (Lorazepam) .... 1mg  [place]  as needed seizures 6)  Celexa 20 Mg Tabs (Citalopram hydrobromide) .... One tab by mouth daily 7)  Minerin Crea (Skin protectants, misc.) .... Apply to lower extremites twice daily 8)  Ultracet 37.5-325 Mg Tabs (Tramadol-acetaminophen) .... 2  tabs po 30 min prior to pt and 1 tab po bid  Appended Document: PCP VISIT correction to HPI - no documented seizure activity ever.  Appended Document: PCP VISIT    Clinical Lists Changes  Observations: Added new observation of HTNSMSUPPACT: Good outcomes (12/05/2009 8:09) Added new observation of HTN PROGRESS: At goal (12/05/2009 8:09) Added new observation of HTN FSREVIEW: Yes (12/05/2009 8:09) Added new observation of LIPID PROGRS: Unchanged (12/05/2009 8:09) Added new observation of LIPID FSREVW: Yes (12/05/2009 8:09) Added new observation of DM PROGRESS: N/A (12/05/2009 8:09) Added new observation of DM FSREVIEW: N/A (12/05/2009 8:09)       Prevention & Chronic Care Immunizations   Influenza vaccine: Fluvax 3+  (07/09/2009)   Influenza vaccine due: 06/04/2010    Tetanus booster: Not documented    Pneumococcal vaccine: given  (05/20/2009)    H. zoster vaccine: Not documented  Colorectal Screening   Hemoccult: Not documented    Colonoscopy: Not documented  Other Screening   Pap smear: Not documented   Pap smear action/deferral: Not indicated-other  (08/15/2009)    Mammogram: Not documented  DXA bone density scan: Not documented   DXA bone density action/deferral: Not indicated  (08/15/2009)   Smoking status: never  (10/08/2009)  Lipids   Total Cholesterol: Not documented   LDL: 107  (10/10/2009)   LDL Direct: Not documented   HDL: 43  (10/10/2009)   Triglycerides: 189  (10/10/2009)    SGOT (AST): Not documented   SGPT (ALT): Not documented   Alkaline phosphatase: Not documented   Total bilirubin: Not documented    Lipid flowsheet reviewed?: Yes   Progress toward LDL goal: Unchanged  Hypertension   Last Blood Pressure: 124 / 70  (11/12/2009)   Serum creatinine: 0.41  (10/10/2009)   Serum potassium 3.8  (10/10/2009)    Hypertension flowsheet reviewed?: Yes   Progress toward BP goal: At goal  Self-Management Support :   Personal Goals (by  the next clinic visit) :      Personal blood pressure goal: 130/80  (07/23/2009)     Personal LDL goal: 100  (08/15/2009)    Hypertension self-management support: Not documented    Hypertension self-management support not done because: Good outcomes  (12/05/2009)    Lipid self-management support: Not documented     Appended Document: PCP VISIT/Discharge Summary Ultracet d/c'ed given possibility of decreasing seizure threshold. will continue acetaminophen.

## 2010-11-03 NOTE — Miscellaneous (Signed)
Summary: PCP VISIT   Vital Signs:  Patient profile:   70 year old female Weight:      178 pounds  Primary Care Provider:  Eustaquio Boyden  MD, Neurosurgen is Dr Beverlyn Roux   History of Present Illness: CC: nursing home visit.  Rm: 110B  Weights: 07/2009 - stable at 175lbs.                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  Pt with PEG Arlyce Dice) and [place] [facility]).  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  PEG replaced mid august 2/2 malfunction.  Readmitted 07/2009 for shunt revision given concern over decreased mental status.    Trache decannulated 09/2009!  Stopped tube feeds today 2/2 100% by mouth intake at meals.    Ms Alverio is doing great today.  She does endorse knee pain bilaterally, but L > R (L side is where she had knee replacement surgery last year) after a fall last month.  Xray report reviewed with no acute process but evidence of OA bilaterally.  She says ibuprofen did help with this but only given for 7 days.  She states she has trouble remembering names at times.  Habits & Providers  Alcohol-Tobacco-Diet     Alcohol drinks/day: 0     Tobacco Status: never  Exercise-Depression-Behavior     Drug Use: never  Current Medications (verified): 1)  Hydrochlorothiazide 12.5 Mg Caps (Hydrochlorothiazide) .... One Daily 2)  Norvasc 10 Mg Tabs (Amlodipine Besylate) .... One Daily 3)  Protonix 40 Mg Tbec (Pantoprazole Sodium) .... One Bid 4)  Keppra 500 Mg Tabs (Levetiracetam) .... One Per Gtube Two Times A Day 5)  Colace 100 Mg Caps (Docusate Sodium) .... One By Mouth Two Times A Day Per Gtube, Hold For Loose Stools 6)  Free Water Flushes .... 240cc Per Gt Q4 Hours 7)  Reglan 10 Mg Tabs (Metoclopramide Hcl) .... Q6  Hours As Needed Nausea 8)  Ssi .... Per Protocol, Sensitive, With Cbgs  Qac Hs 9)  Doxazosin Mesylate 1 Mg Tabs (Doxazosin Mesylate) .... 2 Tabs Daily 10)  Lorazepam 2 Mg/ml Soln (Lorazepam) .... 1mg  [place]  As Needed Seizures 11)  Hydrocodone-Acetaminophen 10-325 Mg/1ml Soln (Hydrocodone-Acetaminophen) .... Every 4 Hours As Needed Pain 12)  Citalopram Hydrobromide 10 Mg Tabs (Citalopram Hydrobromide) .... One Daily 13)  Potassium Chloride 40 Meq/14ml (20%) Liqd (Potassium Chloride) .... Daily 14)  Sodium Chloride 1 Gm Tabs (Sodium Chloride) .... Two Q6 Hours 15)  Naprosyn 500 Mg Tabs (Naproxen) .... Take One By Mouth Two Times A Day As Needed Knee Pain  Allergies (verified): No Known Drug Allergies  Past History:  Past medical, surgical, family and social histories (including risk factors) reviewed for relevance to current acute and chronic problems.  Past Medical History: Reviewed history from 06/06/2009 and no changes required. Proteus UTI sensitive to Keflex (05/2009) with urinary retention L frontal hemorrhage from AVM s/p craniotomy and resection Communicating Hydrocephalus s/p VP shunt Trache, PEG 04/2009 Anemia, Hyponatremia HTN HLD OA Osteoporsis Obesity  Past Surgical History: Craniotomy with hematoma evacuation and AVM resection 04/11/2009 Tracheostomy 04/18/2009 PEG placement 04/2009 VP Shunt placement 05/01/2009 VP shunt revision October 2010 L total knee replacement 2009  Family History: Reviewed history from 05/12/2009 and no changes required. Mother - DM Father - Prosate CA 1 sister with brain aneurysm HTN  in family  Social History: Reviewed history from 07/16/2009 and no changes required. Currently resident at [facility].  This patient lives in the [place], [place], area.  She is not married, has 3 children who are alive and well.  The patient has not worked.  No EtOH, smoking.  Daughter Clyde Upshaw. Smoking Status:  never Drug Use:  never  Physical Exam  General:  Alert, interactive and appropriate.   just came back from rehab where she says she had trouble with LE exercises Head:  left frontoparietal surgical wound healed.  left parietal wound healing well with underlying tubular structure Mouth:  MMM Neck:  trache out, scar healed well. Lungs:  normal respiratory effort and normal breath sounds.   Heart:  normal rate, regular rhythm, and no murmur.   Abdomen:  soft, non tender, no masses, no guarding, no distention, PEG in place dressing clean, dry and intact. 6 cm horizontal lower abdomen surg scar without signs of infection. Extremities:  no c/c/e. Skin:  turgor normal and no rashes.     Impression & Recommendations:  Problem # 1:  TRACHEOSTOMY STATUS (ICD-V44.0) trache decannulated 09/2009.  healed well.  Problem # 2:  URINARY RETENTION (ICD-788.20)  Improved.  continue doxazosin for now.  Problem # 3:  HYPONATREMIA (ICD-276.1)  last Na 139.  currently receiving 2gm NACL QID.  recheck BMP, hopeful to be able to stop this.  Problem # 4:  HYPERTENSION (ICD-401.9) continue. Her updated medication list for this problem includes:    Hydrochlorothiazide 12.5 Mg Caps (Hydrochlorothiazide) ..... One daily    Norvasc 10 Mg Tabs (Amlodipine besylate) ..... One daily    Doxazosin Mesylate 1 Mg Tabs (Doxazosin mesylate) .Marland Kitchen... 2 tabs daily  Problem # 5:  HYPERLIPIDEMIA (ICD-272.4)  FLP with next blood draw.  (ordered 08/2009, but they got LFTs instead).  Problem # 6:  DEGENERATIVE JOINT DISEASE (ICD-715.90) recent xray nothing acute.  start naprosyn as needed. Her updated medication list for this problem includes:    Hydrocodone-acetaminophen 10-325 Mg/76ml Soln (Hydrocodone-acetaminophen) ..... Every 4 hours as needed pain    Naprosyn 500 Mg Tabs (Naproxen) .Marland Kitchen... Take one by mouth two times a day as needed knee pain  Problem # 7:  OSTEOPOROSIS (ICD-733.00) may need dexa in future.  consider starting Ca/Vit D.  ( i had started 08/2009, but it was d/c'd.  unsure why.)  Problem  # 8:  ANEMIA (ICD-285.9) Hgb slowly increasing.  continue to monitor.    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  rehab continues.  Problem # 10:  GASTROSTOMY STATUS (ICD-V44.1) off tube feeds!  doing very well with by mouth intake.  hopeful for removal of G tube in next few months.  Patient happy about this as well.  fasting cbg's have been 74-130s in last week.  no dx of diabetes.  A1c 5.9%.  borderline prediabetes.  Unsure how important it is to continue checking sugars, but will continue to closely monitor for now, especially in tranisition from TF to by mouth.  Complete Medication List: 1)  Hydrochlorothiazide 12.5 Mg Caps (Hydrochlorothiazide) .... One daily 2)  Norvasc 10 Mg Tabs (Amlodipine besylate) .... One daily 3)  Protonix 40 Mg Tbec (Pantoprazole sodium) .... One bid 4)  Keppra 500 Mg Tabs (Levetiracetam) .... One per gtube two times a day 5)  Colace 100 Mg Caps (Docusate sodium) .... One by mouth two times a day per gtube, hold for loose stools 6)  Free Water  Flushes  .... 240cc per gt q4 hours 7)  Reglan 10 Mg Tabs (Metoclopramide hcl) .... Q6  hours as needed nausea 8)  Ssi  .... Per protocol, sensitive, with cbgs qac hs 9)  Doxazosin Mesylate 1 Mg Tabs (Doxazosin mesylate) .... 2 tabs daily 10)  Lorazepam 2 Mg/ml Soln (Lorazepam) .... 1mg  [place]  as needed seizures 11)  Hydrocodone-acetaminophen 10-325 Mg/50ml Soln (Hydrocodone-acetaminophen) .... Every 4 hours as needed pain 12)  Citalopram Hydrobromide 10 Mg Tabs (Citalopram hydrobromide) .... One daily 13)  Potassium Chloride 40 Meq/14ml (20%) Liqd (Potassium chloride) .... daily 14)  Sodium Chloride 1 Gm Tabs (Sodium chloride) .... Two q6 hours 15)  Naprosyn 500 Mg Tabs (Naproxen) .... Take one by mouth two times a day as needed knee pain

## 2010-11-03 NOTE — Progress Notes (Signed)
Summary: triage  Phone Note Other Incoming Call back at [phone]   Caller: [facility] Summary of Call: Needs an FL2 form signed.  Who can do this for her? Initial call taken by: Clydell Hakim,  October 21, 2009 10:28 AM  Follow-up for Phone Call        left message for above social worker. to bring to [place] & I will get another md to complete & sign Follow-up by: Golden Circle RN,  October 21, 2009 10:36 AM

## 2010-11-03 NOTE — Miscellaneous (Signed)
Summary: pt discharged from [facility]  Clinical Lists Changes  Problems: Removed problem of PERSON LIVING IN RESIDENTIAL INSTITUTION (ICD-V60.6) Observations: Removed observation of PT ROOM#: 110B (10/08/2009 15:58) Removed observation of PT ROOM#: 120  (09/10/2009 11:20) Removed observation of PT ROOM#: 124  (05/26/2009 18:49)

## 2010-11-03 NOTE — Miscellaneous (Signed)
Summary: Med Update - Rx  Clinical Lists Changes  Medications: Removed medication of CELEXA 10 MG TABS (CITALOPRAM HYDROBROMIDE) one tablet by mouth once daily Removed medication of * FREE WATER FLUSHES 240cc per GT Q4 hours Removed medication of SODIUM CHLORIDE 1 GM TABS (SODIUM CHLORIDE) two daily Added new medication of SODIUM CHLORIDE 1 GM TABS (SODIUM CHLORIDE) 2 two times a day - Signed Removed medication of * SSI per protocol, sensitive, with cbgs qac hs Rx of SODIUM CHLORIDE 1 GM TABS (SODIUM CHLORIDE) 2 two times a day;  #1 x 0;  Signed;  Entered by: Christian Mate D;  Authorized by: Madelon Lips Pharm D;  Method used: Historical    Prescriptions: SODIUM CHLORIDE 1 GM TABS (SODIUM CHLORIDE) 2 two times a day  #1 x 0   Entered and Authorized by:   Christian Mate D   Signed by:   Madelon Lips Pharm D on 10/29/2009   Method used:   Historical   RxID:   [id number]

## 2010-11-03 NOTE — Progress Notes (Signed)
Summary: pt needs appt to fill out FL 2 form/ts  Phone Note Call from Patient Call back at [phone]    Caller: Veronica/Friend Reason for Call: Talk to Nurse Summary of Call: pt was recently discharged from nursing home, has FL2 form that needs to be filled out to have pt placed back in nursing home. Initial call taken by: Knox Royalty,  December 15, 2009 3:01 PM  Follow-up for Phone Call        have her drop off FL2 form.  thanks. Follow-up by: Eustaquio Boyden  MD,  December 15, 2009 4:11 PM  Additional Follow-up for Phone Call Additional follow up Details #1::        CALLED and no answer @ [phone].  called [phone]. lmam to return call. unidentified answering machine. Additional Follow-up by: Arlyss Repress CMA,,  December 16, 2009 5:03 PM    Additional Follow-up for Phone Call Additional follow up Details #2::    FL2 paperwork being completed today by Dr. Sharen Hones.  Randon Goldsmith we would call her when it was ready. Follow-up by: Dennison Nancy RN,  December 17, 2009 11:43 AM  Additional Follow-up for Phone Call Additional follow up Details #3:: Details for Additional Follow-up Action Taken: Unable to complete without bringing patient back into office.  Will ask her to come for follow up visit.  Spoke with Daughter Misty Stanley at  [phone] who actually lives in [place].  Artemis is living with her brother Fayrene Fearing in [place], [place] # [phone].  Apparently she is not doing well and daughter thinks she needs more care than can be provided at home.  Daughter prefers Shearon return to a facility in [place].  Daughter thinks Paulette will need a Zenaida Niece to be able to come into clinic.  how can we organize this?  Tried to call [employer].  no answer.  left message to call us back.  Can we call to schedule patient to come in to be seen?  best number is likely [phone].  thanks. Additional Follow-up by: Eustaquio Boyden  MD,  December 18, 2009 2:42 PM  called pt and lmam to sched. appt with dr.gutierrez. Arlyss Repress CMA,  December 19, 2009 11:01 AM

## 2010-11-03 NOTE — Miscellaneous (Signed)
Summary: Med Update - Rx  Clinical Lists Changes  Medications: Removed medication of DOXAZOSIN MESYLATE 1 MG TABS (DOXAZOSIN MESYLATE) 2 tabs daily Added new medication of DOXAZOSIN MESYLATE 2 MG TABS (DOXAZOSIN MESYLATE) once daily - Signed Added new medication of KLOR-CON 20 MEQ PACK (POTASSIUM CHLORIDE) one daily - Signed Removed medication of SODIUM CHLORIDE 1 GM TABS (SODIUM CHLORIDE) 2 two times a day Added new medication of MINERIN  CREA (SKIN PROTECTANTS, MISC.) apply to lower extremites twice daily - Signed Rx of DOXAZOSIN MESYLATE 2 MG TABS (DOXAZOSIN MESYLATE) once daily;  #1 x 0;  Signed;  Entered by: Christian Mate D;  Authorized by: Madelon Lips Pharm D;  Method used: Historical Rx of KLOR-CON 20 MEQ PACK (POTASSIUM CHLORIDE) one daily;  #1 x 0;  Signed;  Entered by: Christian Mate D;  Authorized by: Madelon Lips Pharm D;  Method used: Historical Rx of MINERIN  CREA (SKIN PROTECTANTS, MISC.) apply to lower extremites twice daily;  #1 x 0;  Signed;  Entered by: Christian Mate D;  Authorized by: Madelon Lips Pharm D;  Method used: Historical    Prescriptions: MINERIN  CREA (SKIN PROTECTANTS, MISC.) apply to lower extremites twice daily  #1 x 0   Entered and Authorized by:   Christian Mate D   Signed by:   Madelon Lips Pharm D on 11/05/2009   Method used:   Historical   RxID:   [id number] KLOR-CON 20 MEQ PACK (POTASSIUM CHLORIDE) one daily  #1 x 0   Entered and Authorized by:   Christian Mate D   Signed by:   Madelon Lips Pharm D on 11/05/2009   Method used:   Historical   RxID:   [id number] DOXAZOSIN MESYLATE 2 MG TABS (DOXAZOSIN MESYLATE) once daily  #1 x 0   Entered and Authorized by:   Christian Mate D   Signed by:   Madelon Lips Pharm D on 11/05/2009   Method used:   Historical   RxID:   [id number]

## 2010-11-03 NOTE — Assessment & Plan Note (Signed)
Summary: NH visit    Vital Signs:  Patient profile:   70 year old female Height:      68 inches Weight:      178 pounds Temp:     98 degrees F Pulse rate:   68 / minute Resp:     20 per minute BP sitting:   124 / 70  Vitals Entered By: staff on 10/25/09  Primary Care Provider:  Zachery Dauer, MD    History of Present Illness: Generally feels she is improving. Some cough and left ear pain. Happy to hear she will be going to live with her daughter around Mar 8th.   Current Medications (verified): 1)  Norvasc 10 Mg Tabs (Amlodipine Besylate) .... One Daily 2)  Protonix 40 Mg Tbec (Pantoprazole Sodium) .... Once Daily 3)  Keppra 500 Mg Tabs (Levetiracetam) .... One Per Gtube Two Times A Day 4)  Colace 100 Mg Caps (Docusate Sodium) .... One By Mouth Two Times A Day Per Gtube, Hold For Loose Stools 5)  Lorazepam 2 Mg/ml Soln (Lorazepam) .... 1mg  [place]  As Needed Seizures 6)  Celexa 20 Mg Tabs (Citalopram Hydrobromide) .... One Tab By Mouth Daily 7)  Naprosyn 500 Mg Tabs (Naproxen) .... Take One By Mouth Two Times A Day As Needed Knee Pain 8)  Klor-Con 20 Meq Pack (Potassium Chloride) .... Once Daily 9)  Hydrocodone-Acetaminophen 7.5-500 Mg Tabs (Hydrocodone-Acetaminophen) .Marland Kitchen.. 1 Q4 Prn 10)  Doxazosin Mesylate 2 Mg Tabs (Doxazosin Mesylate) .... Once Daily 11)  Klor-Con 20 Meq Pack (Potassium Chloride) .... One Daily 12)  Minerin  Crea (Skin Protectants, Misc.) .... Apply To Lower Extremites Twice Daily  Allergies (verified): No Known Drug Allergies  Physical Exam  General:  Alert, interactive and appropriate.   Ears:  External ear exam shows no significant lesions or deformities.  Otoscopic examination reveals clear canals, tympanic membranes are intact bilaterally without bulging, retraction, inflammation or discharge. Hearing is grossly normal bilaterally. Mouth:  Oral mucosa and oropharynx without lesions or exudates.  Teeth in good repair. Neck:  Healed trach site Lungs:  Normal  respiratory effort, chest expands symmetrically. Lungs are clear to auscultation, no crackles or wheezes. Heart:  Normal rate and regular rhythm. S1 and S2 normal without gallop, murmur, click, rub or other extra sounds. Extremities:  1+ left pedal edema and 1+ right pedal edema.   Neurologic:  No pronator drift. 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 One daily 2)  Protonix 40 Mg Tbec (Pantoprazole sodium) .... Once daily 3)  Keppra 500 Mg Tabs (Levetiracetam) .... One per gtube two times a day 4)  Colace 100 Mg Caps (Docusate sodium) .... One by mouth two times a day per gtube, hold for loose stools 5)  Lorazepam 2 Mg/ml Soln (Lorazepam) .... 1mg  [place]  as needed seizures 6)  Celexa 20 Mg Tabs (Citalopram hydrobromide) .... One tab by mouth daily 7)  Naprosyn 500 Mg Tabs (Naproxen) .... Take one by mouth two times a day as needed knee pain 8)  Klor-con 20 Meq Pack (Potassium chloride) .... Once daily 9)  Hydrocodone-acetaminophen 7.5-500 Mg Tabs (Hydrocodone-acetaminophen) .Marland Kitchen.. 1 q4 prn 10)  Doxazosin Mesylate 2 Mg Tabs (Doxazosin mesylate) .... Once  daily 11)  Minerin Crea (Skin protectants, misc.) .... Apply to lower extremites twice daily    Prevention & Chronic Care Immunizations   Influenza vaccine: Fluvax 3+  (07/09/2009)   Influenza vaccine due: 06/04/2010    Tetanus booster: Not documented    Pneumococcal vaccine: given  (05/20/2009)    H. zoster vaccine: Not documented  Colorectal Screening   Hemoccult: Not documented    Colonoscopy: Not documented  Other Screening   Pap smear: Not documented   Pap smear action/deferral: Not indicated-other   (08/15/2009)    Mammogram: Not documented    DXA bone density scan: Not documented   DXA bone density action/deferral: Not indicated  (08/15/2009)   Smoking status: never  (10/08/2009)  Lipids   Total Cholesterol: Not documented   LDL: 107  (10/10/2009)   LDL Direct: Not documented   HDL: 43  (10/10/2009)   Triglycerides: 189  (10/10/2009)    SGOT (AST): Not documented   SGPT (ALT): Not documented   Alkaline phosphatase: Not documented   Total bilirubin: Not documented    Lipid flowsheet reviewed?: Yes   Progress toward LDL goal: Improved  Hypertension   Last Blood Pressure: 124 / 70  (11/12/2009)   Serum creatinine: 0.41  (10/10/2009)   Serum potassium 3.8  (10/10/2009)    Hypertension flowsheet reviewed?: Yes   Progress toward BP goal: At goal  Self-Management Support :   Personal Goals (by the next clinic visit) :      Personal blood pressure goal: 130/80  (07/23/2009)     Personal LDL goal: 100  (08/15/2009)    Hypertension self-management support: Not documented    Lipid self-management support: Not documented

## 2010-11-03 NOTE — Miscellaneous (Signed)
Primary Care Provider:  Zachery Dauer, MD    History of Present Illness: Daughter and patient asking question about discharge to home.  We discussed with daughter the fact that her mother will require 24 hour help and supervision for safety, mobility, and help with ADLs.  Daughter announced that she will be leaving the area for an unknown time and will not be able to take her mother home.  The only option if for Mrs. Lodato to stay with her brother and he is hesitate to do so.  Mrs. Azpeitia has been working hard in therapy.  She is able to transfer with assistance, she cannot ambulate.  She needs help with dressing and toileting.  She is continent of urine if help in time.  She complains of knee pain, especially when in PT.  Current Medications (verified): 1)  Norvasc 10 Mg Tabs (Amlodipine Besylate) .... One Daily 2)  Protonix 40 Mg Tbec (Pantoprazole Sodium) .... Once Daily 3)  Keppra 500 Mg Tabs (Levetiracetam) .... One Per Gtube Two Times A Day 4)  Colace 100 Mg Caps (Docusate Sodium) .... One By Mouth Two Times A Day Per Gtube, Hold For Loose Stools 5)  Lorazepam 2 Mg/ml Soln (Lorazepam) .... 1mg  [place]  As Needed Seizures 6)  Celexa 20 Mg Tabs (Citalopram Hydrobromide) .... One Tab By Mouth Daily 7)  Naprosyn 500 Mg Tabs (Naproxen) .... Take One By Mouth Two Times A Day As Needed Knee Pain 8)  Klor-Con 20 Meq Pack (Potassium Chloride) .... Once Daily 9)  Hydrocodone-Acetaminophen 7.5-500 Mg Tabs (Hydrocodone-Acetaminophen) .Marland Kitchen.. 1 Q4 Prn 10)  Doxazosin Mesylate 2 Mg Tabs (Doxazosin Mesylate) .... Once Daily 11)  Minerin  Crea (Skin Protectants, Misc.) .... 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   Will follow this issue.    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 1 q4 prn Her updated medication list for this problem includes:    Naprosyn 500 Mg Tabs (Naproxen) .Marland Kitchen... Take one by mouth two times a day as needed knee pain    Ultracet 37.5-325 Mg Tabs (Tramadol-acetaminophen) .Marland KitchenMarland KitchenMarland KitchenMarland Kitchen 30 min prior to pt and afternoon and hs  Orders: Provider Misc Charge- [facility] (Misc)  Problem # 4:  URINARY RETENTION (ICD-788.20) resolved Orders: Provider Misc Charge- [facility] (Misc)  Problem # 5:  HYPONATREMIA (ICD-276.1) resolved Orders: Provider Misc Charge- [facility] (Misc)  Complete Medication List: 1)  Norvasc 10 Mg Tabs (Amlodipine besylate) .... One daily 2)  Protonix 40 Mg Tbec (Pantoprazole sodium) .... Once daily 3)  Keppra 500 Mg Tabs (Levetiracetam) .... One per gtube two times a day 4)  Colace 100 Mg Caps (Docusate sodium) .... One by mouth two times a day per gtube, hold for loose stools 5)  Lorazepam 2 Mg/ml Soln (Lorazepam) .... 1mg  [place]  as needed seizures 6)  Celexa 20 Mg Tabs (Citalopram hydrobromide) .... One tab by mouth daily 7)  Naprosyn 500 Mg Tabs (Naproxen) .... Take one  by mouth two times a day as needed knee pain 8)  Klor-con 20 Meq Pack (Potassium chloride) .... Once daily 9)  Doxazosin Mesylate 2 Mg Tabs (Doxazosin mesylate) .... Once daily 10)  Minerin Crea (Skin protectants, misc.) .... Apply to lower extremites twice daily 11)  Ultracet 37.5-325 Mg Tabs (Tramadol-acetaminophen) .... 30 min prior to pt and afternoon and hs

## 2010-11-03 NOTE — Letter (Signed)
Summary: short term rehab  [facility]  [address]   [place], [place] [address]   Phone: [phone]  Fax: [phone]    November 12, 2009   Patient:  Gina Parker    To Whom It May Concern:   This is to verify that [name] continues to make steady progress in her recovery from her medical problems with and anticipated discharge from physical therapy in 3 - 4 weeks. Thus her nursing home stay should be classified as short-term for rehabilitation rather than permanent placement. Contact me if specific details are needed.          Sincerely,    Zachery Dauer MD  Appended Document: short term rehab mailed letter to daughter.

## 2010-11-03 NOTE — Miscellaneous (Signed)
Summary: [facility] - Rx  Clinical Lists Changes  Medications: Removed medication of HYDROCODONE-ACETAMINOPHEN 10-325 MG/15ML SOLN (HYDROCODONE-ACETAMINOPHEN) every 4 hours as needed pain Removed medication of POTASSIUM CHLORIDE 40 MEQ/15ML (20%) LIQD (POTASSIUM CHLORIDE) daily Added new medication of KLOR-CON 20 MEQ PACK (POTASSIUM CHLORIDE) once daily - Signed Added new medication of HYDROCODONE-ACETAMINOPHEN 7.5-500 MG TABS (HYDROCODONE-ACETAMINOPHEN) 1 q4 prn - Signed Removed medication of REGLAN 10 MG TABS (METOCLOPRAMIDE HCL) q6  hours as needed nausea Changed medication from PROTONIX 40 MG TBEC (PANTOPRAZOLE SODIUM) one bid to PROTONIX 40 MG TBEC (PANTOPRAZOLE SODIUM) once daily - Signed Changed medication from SODIUM CHLORIDE 1 GM TABS (SODIUM CHLORIDE) two Q6 hours to SODIUM CHLORIDE 1 GM TABS (SODIUM CHLORIDE) two daily - Signed Removed medication of HYDROCHLOROTHIAZIDE 12.5 MG CAPS (HYDROCHLOROTHIAZIDE) one daily Rx of KLOR-CON 20 MEQ PACK (POTASSIUM CHLORIDE) once daily;  #1 x 0;  Signed;  Entered by: Christian Mate D;  Authorized by: Madelon Lips Pharm D;  Method used: Historical Rx of HYDROCODONE-ACETAMINOPHEN 7.5-500 MG TABS (HYDROCODONE-ACETAMINOPHEN) 1 q4 prn;  #1 x 0;  Signed;  Entered by: Christian Mate D;  Authorized by: Madelon Lips Pharm D;  Method used: Historical Rx of PROTONIX 40 MG TBEC (PANTOPRAZOLE SODIUM) once daily;  #1 x 0;  Signed;  Entered by: Christian Mate D;  Authorized by: Madelon Lips Pharm D;  Method used: Historical Rx of SODIUM CHLORIDE 1 GM TABS (SODIUM CHLORIDE) two daily;  #1 x 0;  Signed;  Entered by: Christian Mate D;  Authorized by: Madelon Lips Pharm D;  Method used: Historical    Prescriptions: SODIUM CHLORIDE 1 GM TABS (SODIUM CHLORIDE) two daily  #1 x 0   Entered and Authorized by:   Christian Mate D   Signed by:   Madelon Lips Pharm D on 10/22/2009   Method used:   Historical   RxID:   [id number] PROTONIX 40 MG TBEC  (PANTOPRAZOLE SODIUM) once daily  #1 x 0   Entered and Authorized by:   Christian Mate D   Signed by:   Madelon Lips Pharm D on 10/22/2009   Method used:   Historical   RxID:   [id number] HYDROCODONE-ACETAMINOPHEN 7.5-500 MG TABS (HYDROCODONE-ACETAMINOPHEN) 1 q4 prn  #1 x 0   Entered and Authorized by:   Christian Mate D   Signed by:   Madelon Lips Pharm D on 10/22/2009   Method used:   Historical   RxID:   [id number] KLOR-CON 20 MEQ PACK (POTASSIUM CHLORIDE) once daily  #1 x 0   Entered and Authorized by:   Christian Mate D   Signed by:   Madelon Lips Pharm D on 10/22/2009   Method used:   Historical   RxID:   [id number]

## 2010-11-03 NOTE — Miscellaneous (Signed)
  Clinical Lists Changes  Medications: Changed medication from CITALOPRAM HYDROBROMIDE 10 MG TABS (CITALOPRAM HYDROBROMIDE) one daily to CELEXA 20 MG TABS (CITALOPRAM HYDROBROMIDE) one tab by mouth daily

## 2010-11-03 NOTE — Progress Notes (Signed)
Summary: phn msg, letter for insurance  Phone Note Call from Patient Call back at Home Phone [phone]   Caller: Daughter-Lisa Maisie Fus Summary of Call: Wanted to talk to Dr. Sheffield Slider about her care.  They have her as long term and daughter says she should be short term and [facility] needs letter from Dr. stating that she is short term. Initial call taken by: Clydell Hakim,  November 11, 2009 12:23 PM  Follow-up for Phone Call        Called and left a message on her machine. She can call [facility] in the AM to clarify what she wants the letter to state.  Follow-up by: Zachery Dauer MD,  November 11, 2009 9:27 PM

## 2010-11-03 NOTE — Miscellaneous (Signed)
Summary: FL2  Clinical Lists Changes she dropped it off. I had told her it may be a day or 2. will call her when done. will give to a preceptor to complte.Golden Circle RN  October 21, 2009 3:11 PM  Dr. Lafonda Mosses will be at [facility] tomorrow & will handle then.Golden Circle RN  October 21, 2009 3:34 PM given to Luretha Murphy

## 2010-11-05 NOTE — Miscellaneous (Signed)
  Clinical Lists Changes  Problems: Removed problem of GASTROSTOMY STATUS (ICD-V44.1) Removed problem of History of  TRACHEOSTOMY STATUS (ICD-V44.0)

## 2011-01-07 LAB — URINALYSIS, ROUTINE W REFLEX MICROSCOPIC
Bilirubin Urine: NEGATIVE
Hgb urine dipstick: NEGATIVE
Nitrite: POSITIVE — AB
Protein, ur: NEGATIVE mg/dL
Specific Gravity, Urine: 1.015 (ref 1.005–1.030)
Urobilinogen, UA: 1 mg/dL (ref 0.0–1.0)

## 2011-01-07 LAB — BASIC METABOLIC PANEL
CO2: 28 mEq/L (ref 19–32)
CO2: 27 mEq/L (ref 19–32)
Calcium: 9.4 mg/dL (ref 8.4–10.5)
Chloride: 98 mEq/L (ref 96–112)
Chloride: 97 mEq/L (ref 96–112)
Creatinine, Ser: 0.47 mg/dL (ref 0.4–1.2)
GFR calc Af Amer: >60 mL/min (ref >60)
Glucose, Bld: 145 mg/dL — ABNORMAL HIGH (ref 70–99)
Potassium: 3.1 mEq/L — ABNORMAL LOW (ref 3.5–5.1)
Sodium: 137 mEq/L (ref 135–145)

## 2011-01-07 LAB — CBC
HCT: 36.7 % (ref 36.0–46.0)
Hemoglobin: 11.9 g/dL — ABNORMAL LOW (ref 12.0–15.0)
Hemoglobin: 12.2 g/dL (ref 12.0–15.0)
MCHC: 33.8 g/dL (ref 30.0–36.0)
MCHC: 33.3 g/dL (ref 30.0–36.0)
MCV: 81.8 fL (ref 78.0–100.0)
MCV: 82.6 fL (ref 78.0–100.0)
RBC: 4.45 MIL/uL (ref 3.87–5.11)
RDW: 17.4 % — ABNORMAL HIGH (ref 11.5–15.5)
WBC: 5.7 10*3/uL (ref 4.0–10.5)

## 2011-01-07 LAB — DIFFERENTIAL
Basophils Absolute: 0 10*3/uL (ref 0.0–0.1)
Basophils Relative: 1 % (ref 0–1)
Eosinophils Absolute: 0 10*3/uL (ref 0.0–0.7)
Monocytes Absolute: 0.4 10*3/uL (ref 0.1–1.0)
Monocytes Relative: 5 % (ref 3–12)
Neutro Abs: 6.5 10*3/uL (ref 1.7–7.7)
Neutrophils Relative %: 83 % — ABNORMAL HIGH (ref 43–77)

## 2011-01-07 LAB — URINE MICROSCOPIC-ADD ON

## 2011-01-09 LAB — CARDIAC PANEL(CRET KIN+CKTOT+MB+TROPI)
CK, MB: 0.7 ng/mL (ref 0.3–4.0)
CK, MB: 0.8 ng/mL (ref 0.3–4.0)
Relative Index: INVALID (ref 0.0–2.5)
Relative Index: INVALID (ref 0.0–2.5)
Total CK: 27 U/L (ref 7–177)
Total CK: 31 U/L (ref 7–177)
Total CK: 43 U/L (ref 7–177)
Troponin I: 0.01 ng/mL (ref 0.00–0.06)
Troponin I: 0.02 ng/mL (ref 0.00–0.06)

## 2011-01-09 LAB — GLUCOSE, CAPILLARY
Glucose-Capillary: 101 mg/dL — ABNORMAL HIGH (ref 70–99)
Glucose-Capillary: 101 mg/dL — ABNORMAL HIGH (ref 70–99)
Glucose-Capillary: 104 mg/dL — ABNORMAL HIGH (ref 70–99)
Glucose-Capillary: 105 mg/dL — ABNORMAL HIGH (ref 70–99)
Glucose-Capillary: 106 mg/dL — ABNORMAL HIGH (ref 70–99)
Glucose-Capillary: 110 mg/dL — ABNORMAL HIGH (ref 70–99)
Glucose-Capillary: 110 mg/dL — ABNORMAL HIGH (ref 70–99)
Glucose-Capillary: 112 mg/dL — ABNORMAL HIGH (ref 70–99)
Glucose-Capillary: 114 mg/dL — ABNORMAL HIGH (ref 70–99)
Glucose-Capillary: 115 mg/dL — ABNORMAL HIGH (ref 70–99)
Glucose-Capillary: 118 mg/dL — ABNORMAL HIGH (ref 70–99)
Glucose-Capillary: 123 mg/dL — ABNORMAL HIGH (ref 70–99)
Glucose-Capillary: 125 mg/dL — ABNORMAL HIGH (ref 70–99)
Glucose-Capillary: 129 mg/dL — ABNORMAL HIGH (ref 70–99)
Glucose-Capillary: 129 mg/dL — ABNORMAL HIGH (ref 70–99)
Glucose-Capillary: 130 mg/dL — ABNORMAL HIGH (ref 70–99)
Glucose-Capillary: 131 mg/dL — ABNORMAL HIGH (ref 70–99)
Glucose-Capillary: 131 mg/dL — ABNORMAL HIGH (ref 70–99)
Glucose-Capillary: 132 mg/dL — ABNORMAL HIGH (ref 70–99)
Glucose-Capillary: 132 mg/dL — ABNORMAL HIGH (ref 70–99)
Glucose-Capillary: 133 mg/dL — ABNORMAL HIGH (ref 70–99)
Glucose-Capillary: 134 mg/dL — ABNORMAL HIGH (ref 70–99)
Glucose-Capillary: 134 mg/dL — ABNORMAL HIGH (ref 70–99)
Glucose-Capillary: 135 mg/dL — ABNORMAL HIGH (ref 70–99)
Glucose-Capillary: 135 mg/dL — ABNORMAL HIGH (ref 70–99)
Glucose-Capillary: 137 mg/dL — ABNORMAL HIGH (ref 70–99)
Glucose-Capillary: 140 mg/dL — ABNORMAL HIGH (ref 70–99)
Glucose-Capillary: 141 mg/dL — ABNORMAL HIGH (ref 70–99)
Glucose-Capillary: 142 mg/dL — ABNORMAL HIGH (ref 70–99)
Glucose-Capillary: 143 mg/dL — ABNORMAL HIGH (ref 70–99)
Glucose-Capillary: 143 mg/dL — ABNORMAL HIGH (ref 70–99)
Glucose-Capillary: 143 mg/dL — ABNORMAL HIGH (ref 70–99)
Glucose-Capillary: 146 mg/dL — ABNORMAL HIGH (ref 70–99)
Glucose-Capillary: 88 mg/dL (ref 70–99)
Glucose-Capillary: 97 mg/dL (ref 70–99)

## 2011-01-09 LAB — BASIC METABOLIC PANEL
BUN: 5 mg/dL — ABNORMAL LOW (ref 6–23)
CO2: 28 mEq/L (ref 19–32)
Chloride: 97 mEq/L (ref 96–112)
GFR calc non Af Amer: >60 mL/min (ref >60)
Glucose, Bld: 123 mg/dL — ABNORMAL HIGH (ref 70–99)
Potassium: 3.5 mEq/L (ref 3.5–5.1)
Sodium: 131 mEq/L — ABNORMAL LOW (ref 135–145)

## 2011-01-09 LAB — CBC
HCT: 29 % — ABNORMAL LOW (ref 36.0–46.0)
Hemoglobin: 9.9 g/dL — ABNORMAL LOW (ref 12.0–15.0)
MCV: 86.9 fL (ref 78.0–100.0)
RDW: 14.7 % (ref 11.5–15.5)

## 2011-01-09 LAB — PHOSPHORUS: Phosphorus: 2.2 mg/dL — ABNORMAL LOW (ref 2.3–4.6)

## 2011-01-10 LAB — GLUCOSE, CAPILLARY
Glucose-Capillary: 106 mg/dL — ABNORMAL HIGH (ref 70–99)
Glucose-Capillary: 115 mg/dL — ABNORMAL HIGH (ref 70–99)
Glucose-Capillary: 115 mg/dL — ABNORMAL HIGH (ref 70–99)
Glucose-Capillary: 120 mg/dL — ABNORMAL HIGH (ref 70–99)
Glucose-Capillary: 120 mg/dL — ABNORMAL HIGH (ref 70–99)
Glucose-Capillary: 124 mg/dL — ABNORMAL HIGH (ref 70–99)
Glucose-Capillary: 125 mg/dL — ABNORMAL HIGH (ref 70–99)
Glucose-Capillary: 129 mg/dL — ABNORMAL HIGH (ref 70–99)
Glucose-Capillary: 130 mg/dL — ABNORMAL HIGH (ref 70–99)
Glucose-Capillary: 130 mg/dL — ABNORMAL HIGH (ref 70–99)
Glucose-Capillary: 131 mg/dL — ABNORMAL HIGH (ref 70–99)
Glucose-Capillary: 133 mg/dL — ABNORMAL HIGH (ref 70–99)
Glucose-Capillary: 133 mg/dL — ABNORMAL HIGH (ref 70–99)
Glucose-Capillary: 134 mg/dL — ABNORMAL HIGH (ref 70–99)
Glucose-Capillary: 135 mg/dL — ABNORMAL HIGH (ref 70–99)
Glucose-Capillary: 135 mg/dL — ABNORMAL HIGH (ref 70–99)
Glucose-Capillary: 138 mg/dL — ABNORMAL HIGH (ref 70–99)
Glucose-Capillary: 138 mg/dL — ABNORMAL HIGH (ref 70–99)
Glucose-Capillary: 139 mg/dL — ABNORMAL HIGH (ref 70–99)
Glucose-Capillary: 143 mg/dL — ABNORMAL HIGH (ref 70–99)
Glucose-Capillary: 144 mg/dL — ABNORMAL HIGH (ref 70–99)
Glucose-Capillary: 145 mg/dL — ABNORMAL HIGH (ref 70–99)
Glucose-Capillary: 146 mg/dL — ABNORMAL HIGH (ref 70–99)
Glucose-Capillary: 147 mg/dL — ABNORMAL HIGH (ref 70–99)
Glucose-Capillary: 147 mg/dL — ABNORMAL HIGH (ref 70–99)
Glucose-Capillary: 149 mg/dL — ABNORMAL HIGH (ref 70–99)
Glucose-Capillary: 150 mg/dL — ABNORMAL HIGH (ref 70–99)
Glucose-Capillary: 151 mg/dL — ABNORMAL HIGH (ref 70–99)
Glucose-Capillary: 152 mg/dL — ABNORMAL HIGH (ref 70–99)
Glucose-Capillary: 152 mg/dL — ABNORMAL HIGH (ref 70–99)
Glucose-Capillary: 154 mg/dL — ABNORMAL HIGH (ref 70–99)
Glucose-Capillary: 154 mg/dL — ABNORMAL HIGH (ref 70–99)
Glucose-Capillary: 155 mg/dL — ABNORMAL HIGH (ref 70–99)
Glucose-Capillary: 157 mg/dL — ABNORMAL HIGH (ref 70–99)
Glucose-Capillary: 157 mg/dL — ABNORMAL HIGH (ref 70–99)
Glucose-Capillary: 158 mg/dL — ABNORMAL HIGH (ref 70–99)
Glucose-Capillary: 160 mg/dL — ABNORMAL HIGH (ref 70–99)
Glucose-Capillary: 160 mg/dL — ABNORMAL HIGH (ref 70–99)
Glucose-Capillary: 162 mg/dL — ABNORMAL HIGH (ref 70–99)
Glucose-Capillary: 164 mg/dL — ABNORMAL HIGH (ref 70–99)
Glucose-Capillary: 166 mg/dL — ABNORMAL HIGH (ref 70–99)
Glucose-Capillary: 166 mg/dL — ABNORMAL HIGH (ref 70–99)
Glucose-Capillary: 168 mg/dL — ABNORMAL HIGH (ref 70–99)
Glucose-Capillary: 168 mg/dL — ABNORMAL HIGH (ref 70–99)
Glucose-Capillary: 169 mg/dL — ABNORMAL HIGH (ref 70–99)
Glucose-Capillary: 172 mg/dL — ABNORMAL HIGH (ref 70–99)
Glucose-Capillary: 177 mg/dL — ABNORMAL HIGH (ref 70–99)
Glucose-Capillary: 180 mg/dL — ABNORMAL HIGH (ref 70–99)
Glucose-Capillary: 184 mg/dL — ABNORMAL HIGH (ref 70–99)
Glucose-Capillary: 186 mg/dL — ABNORMAL HIGH (ref 70–99)
Glucose-Capillary: 187 mg/dL — ABNORMAL HIGH (ref 70–99)
Glucose-Capillary: 191 mg/dL — ABNORMAL HIGH (ref 70–99)
Glucose-Capillary: 207 mg/dL — ABNORMAL HIGH (ref 70–99)
Glucose-Capillary: 214 mg/dL — ABNORMAL HIGH (ref 70–99)
Glucose-Capillary: 120 mg/dL — ABNORMAL HIGH (ref 70–99)
Glucose-Capillary: 121 mg/dL — ABNORMAL HIGH (ref 70–99)
Glucose-Capillary: 121 mg/dL — ABNORMAL HIGH (ref 70–99)
Glucose-Capillary: 131 mg/dL — ABNORMAL HIGH (ref 70–99)
Glucose-Capillary: 137 mg/dL — ABNORMAL HIGH (ref 70–99)
Glucose-Capillary: 139 mg/dL — ABNORMAL HIGH (ref 70–99)
Glucose-Capillary: 140 mg/dL — ABNORMAL HIGH (ref 70–99)
Glucose-Capillary: 149 mg/dL — ABNORMAL HIGH (ref 70–99)
Glucose-Capillary: 157 mg/dL — ABNORMAL HIGH (ref 70–99)
Glucose-Capillary: 157 mg/dL — ABNORMAL HIGH (ref 70–99)
Glucose-Capillary: 166 mg/dL — ABNORMAL HIGH (ref 70–99)
Glucose-Capillary: 169 mg/dL — ABNORMAL HIGH (ref 70–99)
Glucose-Capillary: 170 mg/dL — ABNORMAL HIGH (ref 70–99)
Glucose-Capillary: 171 mg/dL — ABNORMAL HIGH (ref 70–99)
Glucose-Capillary: 173 mg/dL — ABNORMAL HIGH (ref 70–99)
Glucose-Capillary: 177 mg/dL — ABNORMAL HIGH (ref 70–99)
Glucose-Capillary: 180 mg/dL — ABNORMAL HIGH (ref 70–99)
Glucose-Capillary: 181 mg/dL — ABNORMAL HIGH (ref 70–99)
Glucose-Capillary: 182 mg/dL — ABNORMAL HIGH (ref 70–99)
Glucose-Capillary: 199 mg/dL — ABNORMAL HIGH (ref 70–99)
Glucose-Capillary: 203 mg/dL — ABNORMAL HIGH (ref 70–99)
Glucose-Capillary: 206 mg/dL — ABNORMAL HIGH (ref 70–99)
Glucose-Capillary: 209 mg/dL — ABNORMAL HIGH (ref 70–99)
Glucose-Capillary: 215 mg/dL — ABNORMAL HIGH (ref 70–99)
Glucose-Capillary: 217 mg/dL — ABNORMAL HIGH (ref 70–99)
Glucose-Capillary: 224 mg/dL — ABNORMAL HIGH (ref 70–99)
Glucose-Capillary: 228 mg/dL — ABNORMAL HIGH (ref 70–99)

## 2011-01-10 LAB — CULTURE, BAL-QUANTITATIVE W GRAM STAIN

## 2011-01-10 LAB — URINALYSIS, MICROSCOPIC ONLY
Bilirubin Urine: NEGATIVE
Glucose, UA: 500 mg/dL — AB
Glucose, UA: 100 mg/dL — AB
Hgb urine dipstick: NEGATIVE
Leukocytes, UA: NEGATIVE
Nitrite: NEGATIVE
Protein, ur: 30 mg/dL — AB
Protein, ur: 30 mg/dL — AB
Urobilinogen, UA: 2 mg/dL — ABNORMAL HIGH (ref 0.0–1.0)
pH: 5 (ref 5.0–8.0)

## 2011-01-10 LAB — CROSSMATCH: Antibody Screen: NEGATIVE

## 2011-01-10 LAB — BASIC METABOLIC PANEL
BUN: 11 mg/dL (ref 6–23)
BUN: 11 mg/dL (ref 6–23)
BUN: 12 mg/dL (ref 6–23)
BUN: 14 mg/dL (ref 6–23)
BUN: 9 mg/dL (ref 6–23)
BUN: 9 mg/dL (ref 6–23)
BUN: 5 mg/dL — ABNORMAL LOW (ref 6–23)
BUN: 8 mg/dL (ref 6–23)
BUN: 9 mg/dL (ref 6–23)
CO2: 25 mEq/L (ref 19–32)
CO2: 26 mEq/L (ref 19–32)
CO2: 26 mEq/L (ref 19–32)
CO2: 26 mEq/L (ref 19–32)
CO2: 27 mEq/L (ref 19–32)
CO2: 28 mEq/L (ref 19–32)
CO2: 30 mEq/L (ref 19–32)
CO2: 25 mEq/L (ref 19–32)
CO2: 26 mEq/L (ref 19–32)
Calcium: 8 mg/dL — ABNORMAL LOW (ref 8.4–10.5)
Calcium: 8.1 mg/dL — ABNORMAL LOW (ref 8.4–10.5)
Calcium: 8.2 mg/dL — ABNORMAL LOW (ref 8.4–10.5)
Calcium: 8.4 mg/dL (ref 8.4–10.5)
Calcium: 8.5 mg/dL (ref 8.4–10.5)
Calcium: 8.6 mg/dL (ref 8.4–10.5)
Calcium: 7.9 mg/dL — ABNORMAL LOW (ref 8.4–10.5)
Calcium: 8.1 mg/dL — ABNORMAL LOW (ref 8.4–10.5)
Calcium: 9.1 mg/dL (ref 8.4–10.5)
Chloride: 87 mEq/L — ABNORMAL LOW (ref 96–112)
Chloride: 87 mEq/L — ABNORMAL LOW (ref 96–112)
Chloride: 89 mEq/L — ABNORMAL LOW (ref 96–112)
Chloride: 89 mEq/L — ABNORMAL LOW (ref 96–112)
Chloride: 95 mEq/L — ABNORMAL LOW (ref 96–112)
Chloride: 95 mEq/L — ABNORMAL LOW (ref 96–112)
Chloride: 98 mEq/L (ref 96–112)
Chloride: 104 mEq/L (ref 96–112)
Chloride: 105 mEq/L (ref 96–112)
Chloride: 97 mEq/L (ref 96–112)
Creatinine, Ser: 0.31 mg/dL — ABNORMAL LOW (ref 0.4–1.2)
Creatinine, Ser: 0.34 mg/dL — ABNORMAL LOW (ref 0.4–1.2)
Creatinine, Ser: 0.34 mg/dL — ABNORMAL LOW (ref 0.4–1.2)
Creatinine, Ser: 0.41 mg/dL (ref 0.4–1.2)
Creatinine, Ser: 0.58 mg/dL (ref 0.4–1.2)
Creatinine, Ser: 0.58 mg/dL (ref 0.4–1.2)
Creatinine, Ser: 0.58 mg/dL (ref 0.4–1.2)
Creatinine, Ser: 0.7 mg/dL (ref 0.4–1.2)
GFR calc Af Amer: >60 mL/min (ref >60)
GFR calc Af Amer: >60 mL/min (ref >60)
GFR calc Af Amer: >60 mL/min (ref >60)
GFR calc Af Amer: >60 mL/min (ref >60)
GFR calc Af Amer: >60 mL/min (ref >60)
GFR calc Af Amer: >60 mL/min (ref >60)
GFR calc Af Amer: >60 mL/min (ref >60)
GFR calc Af Amer: >60 mL/min (ref >60)
GFR calc non Af Amer: >60 mL/min (ref >60)
GFR calc non Af Amer: >60 mL/min (ref >60)
GFR calc non Af Amer: >60 mL/min (ref >60)
GFR calc non Af Amer: >60 mL/min (ref >60)
GFR calc non Af Amer: >60 mL/min (ref >60)
GFR calc non Af Amer: >60 mL/min (ref >60)
GFR calc non Af Amer: >60 mL/min (ref >60)
GFR calc non Af Amer: >60 mL/min (ref >60)
GFR calc non Af Amer: >60 mL/min (ref >60)
GFR calc non Af Amer: >60 mL/min (ref >60)
GFR calc non Af Amer: >60 mL/min (ref >60)
GFR calc non Af Amer: >60 mL/min (ref >60)
Glucose, Bld: 121 mg/dL — ABNORMAL HIGH (ref 70–99)
Glucose, Bld: 137 mg/dL — ABNORMAL HIGH (ref 70–99)
Glucose, Bld: 139 mg/dL — ABNORMAL HIGH (ref 70–99)
Glucose, Bld: 140 mg/dL — ABNORMAL HIGH (ref 70–99)
Glucose, Bld: 145 mg/dL — ABNORMAL HIGH (ref 70–99)
Glucose, Bld: 152 mg/dL — ABNORMAL HIGH (ref 70–99)
Glucose, Bld: 156 mg/dL — ABNORMAL HIGH (ref 70–99)
Glucose, Bld: 171 mg/dL — ABNORMAL HIGH (ref 70–99)
Glucose, Bld: 185 mg/dL — ABNORMAL HIGH (ref 70–99)
Glucose, Bld: 94 mg/dL (ref 70–99)
Potassium: 3.2 mEq/L — ABNORMAL LOW (ref 3.5–5.1)
Potassium: 3.6 mEq/L (ref 3.5–5.1)
Potassium: 3.7 mEq/L (ref 3.5–5.1)
Potassium: 3.9 mEq/L (ref 3.5–5.1)
Potassium: 3.9 mEq/L (ref 3.5–5.1)
Potassium: 4.1 mEq/L (ref 3.5–5.1)
Potassium: 4.2 mEq/L (ref 3.5–5.1)
Potassium: 4.2 mEq/L (ref 3.5–5.1)
Potassium: 4.4 mEq/L (ref 3.5–5.1)
Potassium: 3.6 mEq/L (ref 3.5–5.1)
Sodium: 121 mEq/L — ABNORMAL LOW (ref 135–145)
Sodium: 124 mEq/L — ABNORMAL LOW (ref 135–145)
Sodium: 125 mEq/L — ABNORMAL LOW (ref 135–145)
Sodium: 127 mEq/L — ABNORMAL LOW (ref 135–145)
Sodium: 127 mEq/L — ABNORMAL LOW (ref 135–145)
Sodium: 128 mEq/L — ABNORMAL LOW (ref 135–145)
Sodium: 128 mEq/L — ABNORMAL LOW (ref 135–145)
Sodium: 129 mEq/L — ABNORMAL LOW (ref 135–145)
Sodium: 129 mEq/L — ABNORMAL LOW (ref 135–145)
Sodium: 140 mEq/L (ref 135–145)

## 2011-01-10 LAB — CBC
HCT: 22.8 % — ABNORMAL LOW (ref 36.0–46.0)
HCT: 27.1 % — ABNORMAL LOW (ref 36.0–46.0)
HCT: 28.8 % — ABNORMAL LOW (ref 36.0–46.0)
HCT: 28.9 % — ABNORMAL LOW (ref 36.0–46.0)
HCT: 29.3 % — ABNORMAL LOW (ref 36.0–46.0)
HCT: 29.5 % — ABNORMAL LOW (ref 36.0–46.0)
HCT: 29.8 % — ABNORMAL LOW (ref 36.0–46.0)
HCT: 29.9 % — ABNORMAL LOW (ref 36.0–46.0)
HCT: 30.5 % — ABNORMAL LOW (ref 36.0–46.0)
HCT: 34.1 % — ABNORMAL LOW (ref 36.0–46.0)
Hemoglobin: 10 g/dL — ABNORMAL LOW (ref 12.0–15.0)
Hemoglobin: 10.1 g/dL — ABNORMAL LOW (ref 12.0–15.0)
Hemoglobin: 10.1 g/dL — ABNORMAL LOW (ref 12.0–15.0)
Hemoglobin: 9.1 g/dL — ABNORMAL LOW (ref 12.0–15.0)
Hemoglobin: 9.3 g/dL — ABNORMAL LOW (ref 12.0–15.0)
Hemoglobin: 9.7 g/dL — ABNORMAL LOW (ref 12.0–15.0)
Hemoglobin: 9.7 g/dL — ABNORMAL LOW (ref 12.0–15.0)
Hemoglobin: 9.8 g/dL — ABNORMAL LOW (ref 12.0–15.0)
Hemoglobin: 10.6 g/dL — ABNORMAL LOW (ref 12.0–15.0)
Hemoglobin: 11.5 g/dL — ABNORMAL LOW (ref 12.0–15.0)
MCHC: 33.5 g/dL (ref 30.0–36.0)
MCHC: 34.1 g/dL (ref 30.0–36.0)
MCHC: 34.2 g/dL (ref 30.0–36.0)
MCHC: 34.2 g/dL (ref 30.0–36.0)
MCHC: 34.4 g/dL (ref 30.0–36.0)
MCHC: 34.5 g/dL (ref 30.0–36.0)
MCHC: 34.8 g/dL (ref 30.0–36.0)
MCHC: 32.3 g/dL (ref 30.0–36.0)
MCHC: 34.6 g/dL (ref 30.0–36.0)
MCV: 86.4 fL (ref 78.0–100.0)
MCV: 87.2 fL (ref 78.0–100.0)
MCV: 87.3 fL (ref 78.0–100.0)
MCV: 87.6 fL (ref 78.0–100.0)
MCV: 88.3 fL (ref 78.0–100.0)
MCV: 88.9 fL (ref 78.0–100.0)
MCV: 90 fL (ref 78.0–100.0)
Platelets: 365 10*3/uL (ref 150–400)
Platelets: 459 10*3/uL — ABNORMAL HIGH (ref 150–400)
Platelets: 521 10*3/uL — ABNORMAL HIGH (ref 150–400)
Platelets: 560 10*3/uL — ABNORMAL HIGH (ref 150–400)
Platelets: 594 10*3/uL — ABNORMAL HIGH (ref 150–400)
Platelets: 137 10*3/uL — ABNORMAL LOW (ref 150–400)
Platelets: 171 10*3/uL (ref 150–400)
RBC: 2.6 MIL/uL — ABNORMAL LOW (ref 3.87–5.11)
RBC: 3.29 MIL/uL — ABNORMAL LOW (ref 3.87–5.11)
RBC: 3.36 MIL/uL — ABNORMAL LOW (ref 3.87–5.11)
RBC: 3.39 MIL/uL — ABNORMAL LOW (ref 3.87–5.11)
RBC: 3.34 MIL/uL — ABNORMAL LOW (ref 3.87–5.11)
RBC: 3.49 MIL/uL — ABNORMAL LOW (ref 3.87–5.11)
RBC: 3.74 MIL/uL — ABNORMAL LOW (ref 3.87–5.11)
RDW: 14.4 % (ref 11.5–15.5)
RDW: 14.4 % (ref 11.5–15.5)
RDW: 14.6 % (ref 11.5–15.5)
RDW: 14.6 % (ref 11.5–15.5)
RDW: 14.6 % (ref 11.5–15.5)
RDW: 14.7 % (ref 11.5–15.5)
RDW: 14.7 % (ref 11.5–15.5)
RDW: 14.8 % (ref 11.5–15.5)
RDW: 15 % (ref 11.5–15.5)
RDW: 14.4 % (ref 11.5–15.5)
WBC: 10.1 10*3/uL (ref 4.0–10.5)
WBC: 9.9 10*3/uL (ref 4.0–10.5)
WBC: 10.3 10*3/uL (ref 4.0–10.5)
WBC: 7.8 10*3/uL (ref 4.0–10.5)
WBC: 8.6 10*3/uL (ref 4.0–10.5)

## 2011-01-10 LAB — CALCIUM, IONIZED: Calcium, Ion: 1.06 mmol/L — ABNORMAL LOW (ref 1.12–1.32)

## 2011-01-10 LAB — CULTURE, BLOOD (ROUTINE X 2)
Culture: NO GROWTH
Culture: NO GROWTH
Culture: NO GROWTH

## 2011-01-10 LAB — PHOSPHORUS
Phosphorus: 2.3 mg/dL (ref 2.3–4.6)
Phosphorus: 2.7 mg/dL (ref 2.3–4.6)
Phosphorus: 2.1 mg/dL — ABNORMAL LOW (ref 2.3–4.6)
Phosphorus: <1 mg/dL — CL (ref 2.3–4.6)

## 2011-01-10 LAB — CULTURE, RESPIRATORY W GRAM STAIN

## 2011-01-10 LAB — VANCOMYCIN, TROUGH
Vancomycin Tr: 37 ug/mL (ref 10.0–20.0)
Vancomycin Tr: 6.1 ug/mL — ABNORMAL LOW (ref 10.0–20.0)

## 2011-01-10 LAB — BLOOD GAS, ARTERIAL
Acid-Base Excess: 3.2 mmol/L — ABNORMAL HIGH (ref 0.0–2.0)
Acid-Base Excess: 0.4 mmol/L (ref 0.0–2.0)
Acid-base deficit: 2 mmol/L (ref 0.0–2.0)
Bicarbonate: 20.6 mEq/L (ref 20.0–24.0)
Bicarbonate: 24.3 mEq/L — ABNORMAL HIGH (ref 20.0–24.0)
Drawn by: 277331
FIO2: 0.28 %
FIO2: 0.4 %
FIO2: 1 %
MECHVT: 550 mL
O2 Saturation: 99.5 %
PEEP: 5 cmH2O
Patient temperature: 98.6
Patient temperature: 98.6
Patient temperature: 98.6
RATE: 10 resp/min
TCO2: 27.7 mmol/L (ref 0–100)
TCO2: 21.4 mmol/L (ref 0–100)
pH, Arterial: 7.507 — ABNORMAL HIGH (ref 7.350–7.400)
pO2, Arterial: 167 mmHg — ABNORMAL HIGH (ref 80.0–100.0)

## 2011-01-10 LAB — PROTIME-INR: INR: 1.1 (ref 0.00–1.49)

## 2011-01-10 LAB — URINE CULTURE: Colony Count: NO GROWTH

## 2011-01-10 LAB — DIFFERENTIAL
Basophils Absolute: 0 10*3/uL (ref 0.0–0.1)
Basophils Relative: 0 % (ref 0–1)
Basophils Relative: 0 % (ref 0–1)
Eosinophils Relative: 1 % (ref 0–5)
Eosinophils Relative: 2 % (ref 0–5)
Lymphocytes Relative: 13 % (ref 12–46)
Lymphs Abs: 1.3 10*3/uL (ref 0.7–4.0)
Monocytes Absolute: 0.5 10*3/uL (ref 0.1–1.0)
Monocytes Relative: 11 % (ref 3–12)
Neutro Abs: 7.6 10*3/uL (ref 1.7–7.7)

## 2011-01-10 LAB — COMPREHENSIVE METABOLIC PANEL
ALT: 16 U/L (ref 0–35)
AST: 25 U/L (ref 0–37)
Albumin: 4.2 g/dL (ref 3.5–5.2)
Alkaline Phosphatase: 70 U/L (ref 39–117)
Chloride: 103 mEq/L (ref 96–112)
GFR calc Af Amer: >60 mL/min (ref >60)
Potassium: 2.7 mEq/L — CL (ref 3.5–5.1)
Sodium: 139 mEq/L (ref 135–145)
Total Bilirubin: 1.3 mg/dL — ABNORMAL HIGH (ref 0.3–1.2)

## 2011-01-10 LAB — POCT I-STAT 7, (LYTES, BLD GAS, ICA,H+H)
Acid-Base Excess: 2 mmol/L (ref 0.0–2.0)
Bicarbonate: 25.9 meq/L — ABNORMAL HIGH (ref 20.0–24.0)
HCT: 33 % — ABNORMAL LOW (ref 36.0–46.0)
O2 Saturation: 100 %
Sodium: 135 meq/L (ref 135–145)
TCO2: 27 mmol/L (ref 0–100)
pO2, Arterial: 377 mmHg — ABNORMAL HIGH (ref 80.0–100.0)

## 2011-01-10 LAB — MAGNESIUM
Magnesium: 1.6 mg/dL (ref 1.5–2.5)
Magnesium: 1.7 mg/dL (ref 1.5–2.5)

## 2011-01-10 LAB — TROPONIN I: Troponin I: 0.01 ng/mL (ref 0.00–0.06)

## 2011-01-10 LAB — HEPATIC FUNCTION PANEL
ALT: 143 U/L — ABNORMAL HIGH (ref 0–35)
AST: 103 U/L — ABNORMAL HIGH (ref 0–37)
Alkaline Phosphatase: 122 U/L — ABNORMAL HIGH (ref 39–117)
Bilirubin, Direct: 0.1 mg/dL (ref 0.0–0.3)

## 2011-01-10 LAB — POCT I-STAT GLUCOSE
Glucose, Bld: 241 mg/dL — ABNORMAL HIGH (ref 70–99)
Operator id: 117072

## 2011-01-10 LAB — CK TOTAL AND CKMB (NOT AT ARMC): Relative Index: 0.7 (ref 0.0–2.5)

## 2011-02-16 NOTE — Discharge Summary (Signed)
NAME:  Gina Parker, Gina Parker              ACCOUNT NO.:  [id number]   MEDICAL RECORD NO.:  [id number]          PATIENT TYPE:  INP   LOCATION:  5009                         FACILITY:  [facility]   PHYSICIAN:  Dyke Brackett, M.D.    DATE OF BIRTH:  October 27, 1940   DATE OF ADMISSION:  01/19/2008  DATE OF DISCHARGE:  01/24/2008                               DISCHARGE SUMMARY   Gina Parker was brought to the hospital on the date of her procedure.  A  left total knee replacement was done on January 19, 2008 and her discharge  date will be on January 24, 2008. She came in for a diagnosis of left knee  osteoarthritis.  Her postop diagnosis was also left knee osteoarthritis  and the procedure done was a left total knee replacement.   HISTORY OF PRESENT ILLNESS:  Gina Parker is a 70 year old female who  comes in with severe osteoarthritis.   HOSPITAL COURSE:  On January 19, 2008, she went forward with a left total  knee replacement due to the previous failed conservative treatment.  The  procedure went well with no complications.  She has been admitted to the  hospital following the procedure.  Her first postop day was January 20, 2008.  She was doing well at that time and vitals were stable and on  that day, the drain was pulled.  She continued to have 50% weightbearing  at that point and started with physical therapy.  Postop day #2, vitals  once again were stable.  Laboratories revealed hemoglobin was 9, her  hematocrit was 26.5, white count was 9.8, potassium 3.3, sodium 137,  blood sugar 123.  BUN was 8, creatinine was 0.65.  Chloride was 101 and  carbon dioxide was 28.   Her exam was normal, outside of her complaining of some shortness of  breath, only rarely with physical therapy and was the same as what she  had before she came in and had the operation; therefore, we decided to  watch at that point and continue with physical therapy and continue  passive motion.  Postop day #3, her vitals were all stable  once again  and hemoglobin was 8.6 and hematocrit was 24.9.  It was noted on that  day she was having some left thigh pain, so we decided to proceed  forward with left lower extremity Doppler ultrasound, which came back  negative for any type of DVT.  She continued to still have some baseline  shortness of breath, which asked her to continue with therapy.  However,  on postop day #4, it was noted she was not doing as well with therapy  due to this problem with shortness of breath, so we consulted medicine  at that point; they asked [place] to order a D-dimer and came and saw her.  The D-dimer was elevated; therefore, CTA of her chest was ordered to  rule out a PE, which came back negative, but noted a pulmonary nodule  that was required to be rechecked in 6 months, which will be done by her  primary care physician.  Primary Medicine  believed her shortness of  breath was at her baseline and thought that it was safe for her to go to  a skilled nursing facility or home, assuming that she was ready with  therapy and following her total knee replacement.  Her EKG that was also  done was negative for any type of acute heart disease.  Therefore, on  postop day #5, which was January 24, 2008, her hemoglobin was stable at  9.1, her hematocrit was 26.2, her white blood cell count was 8.0 and  sodium was 136, potassium 3.9, chloride was 97, CO2 was 29, BUN was 6  and creatinine was 0.74 and her glucose was 128.  Her vitals were all  within normal limits.  Exam-wise, the patient's motor ability of her  left lower extremity was normal.  Dorsalis pedis and posterior tibial  pulses were 1+.  Her leg was soft and nontender.  Dressing was changed  and no signs of any erythema, purulent drainage or signs of infection  involving the left knee incision; as such, she was planned for discharge  to a skilled nursing facility on January 24, 2008.  Therefore, for  assessment and plan for discharge, the patient was status  post 5 days  after left total knee replacement, which was performed due to severe  osteoarthritis, and discharged to a skilled nursing facility on January 24, 2008.   ACTIVITY:  Plan was to be continued 50% weightbearing on her left lower  extremity with crutches or a walker, continue passive motion, 8 hours a  day at least to work with home health physical therapy at 50%  weightbearing status.   WOUND CARE:  On postop day #14, she needs to have her staples removed  from her left knee, which would be on Feb 02, 2008.   DIET:  Her diet is continuing to be regular.   FOLLOWUP:  Her primary care physician, Dr. Nehemiah Settle, will continue to  follow here for her baseline shortness of breath, if that becomes an  issue.   CONDITION ON DISCHARGE:  Her condition on discharge was noted to be  stable.  We will plan to recheck her in the office sometime in the week  of Feb 06, 2008 with Dr. Madelon Lips; you can call the number [phone], to  set up an appointment for a recheck.   DISCHARGE MEDICATIONS:  1. Amlodipine 10 mg daily.  2. Gabapentin 600 mg one tab p.o. t.i.d.  3. Coreg 6.25 mg one tab p.o. b.i.d.  4. Simvastatin, which is a 20-mg tablet, p.o. daily.  5. Triamterene/hydrochlorothiazide, which is 37.5/25-mg tablets,      should be one tab p.o. daily.  6. Colace 100 mg p.o. b.i.d.  7. Benadryl 12.5 mg p.o. q.6 h. p.r.n. as-needed medicine.  8. Senokot-S is also p.o. p.r.n. tablet along with Fleet Enema as a      p.r.n. medicine for her for constipation.  9. She also should be on Percocet 5/325 mg one to two tablets p.o. q.4-      6 h. as needed for pain.  10.She also should have the ability to have Tylenol 325-mg tablet one      tab p.o. q.4 h. as needed for fever, but should not exceed 4000 mg      of Tylenol per day.  11.Robaxin 500-mg tablet to be one tab p.o. q.6-8 h. as needed for      muscle spasm and pain.  12.She also has Dulcolax as a 5-mg  tablet that can be an as-needed       medication.  13.Also need to continue her Lovenox, which is 30-mg subcu injection,      every 12 hours for DVT prophylaxis, which needs to be continued      through Feb 02, 2008.  14.Alendronate, which she gets 70 mg per week.      Sharol Given, PA      Dyke Brackett, M.D.  Electronically Signed    JBS/MEDQ  D:  01/24/2008  T:  01/24/2008  Job:  [id number]

## 2011-02-16 NOTE — Consult Note (Signed)
NAME:  Gina Parker, Gina Parker              ACCOUNT NO.:  [id number]   MEDICAL RECORD NO.:  [id number]          PATIENT TYPE:  INP   LOCATION:  5009                         FACILITY:  [facility]   PHYSICIAN:  Ramiro Harvest, MD    DATE OF BIRTH:  Feb 19, 1941   DATE OF CONSULTATION:  DATE OF DISCHARGE:                                 CONSULTATION   REASON FOR CONSULTATION:  Shortness of breath.   HISTORY OF PRESENT ILLNESS:  Gina Parker is a 70 year old African  American female with a history of hypertension, osteoarthritis,  hyperlipidemia, bronchitis, and history of chronic shortness of breath,  who was admitted on January 19, 2008, for left total knee replacement.  The patient during the hospitalization was getting PT/OT.  The patient  was also complaining of shortness of breath while working with PT/OT.  We will call to consult for further evaluation and management.  She has  had no change in the chronic shortness of breath from baseline and this  is usual shortness of breath.  The patient denies any fever, no chills,  no chest pain, no occasional palpitations, no nausea, no vomiting, no  abdominal pain, no hematemesis, no hematochezia, and no other associated  symptoms.   ALLERGIES:  No known drug allergies.   PAST MEDICAL HISTORY:  1. Hypertension.  2. Osteoarthritis.  3. Questionable history of prior irregular heartbeat.  4. Status post hysterectomy in 1986.  5. Status post left knee arthroscopy in 2002.  6. History of migraines.  7. Cataracts.  8. The history of TB in 1972, status post treatment.  9. Hyperlipidemia.  10.Constipation.  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   HOME MEDICATIONS:  1. Lipitor 10 mg daily.  2. Norvasc 10 mg daily.  3. Gabapentin 600 mg t.i.d.  4. Alendronate 70 mg weekly.  5. Triamterene/hydrochlorothiazide 37.5/25 mg daily.  6. Coreg 6.25 mg b.i.d.  7. Diclofenac 75 mg b.i.d.  8. Ambien 10 mg q.h.s. p.r.n.  9. Aspirin 81 mg daily.  10.Cod liver oil.  11.Dulcolax as stool softener.   HOSPITAL MEDICATIONS:  1. Norvasc 10 mg daily.  2. Coreg 6.25 mg b.i.d.  3. Colace 100 mg b.i.d.  4. Lovenox 30 mg q.12h.  5. Gabapentin 600 mg t.i.d.  6. Zocor 20 mg daily.  7. Maxzide 37.5/25 mg daily.   SOCIAL HISTORY:  The patient has a prior 50-pack-year tobacco history,  quit in 1980, is widowed.  No alcohol use.  No IV drug use.  The patient  lives alone, has 3 healthy children.   FAMILY HISTORY:  Father died at age 18.  He had a history of coronary  artery disease, prostate cancer, and hypertension.  Mother died at age  38.  She had a history of diabetes, CVA, hypertension, colon cancer, and  questionable chronic renal insufficiency.  The patient has 5 brothers  with a medical history significant for hypertension, diabetes, prostate  cancer, arthritis, hypertension.  Has 1 sister with hypertension,  diabetes, status post kidney transplant, and 1 sister deceased from a  cerebral aneurysm.   REVIEW OF SYSTEMS:  Positive for chronic  shortness of breath, chronic  chest pain with a negative stress test in 2007 per patient.   PHYSICAL EXAMINATION:  VITAL SIGNS:  Temperature 97.8, pulse of 97,  blood pressure 99/64, respiratory rate 18, and saturating 97% on room  air.  GENERAL:  The patient is sitting in chair, in no apparent distress.  HEENT:  Normocephalic, atraumatic.  Pupils equal, round, and reactive to  light.  Extraocular movements intact.  Oropharynx is clear.  No lesions.  No exudates.  NECK:  Supple.  No lymphadenopathy.  RESPIRATORY:  Lungs are clear to auscultation bilaterally.  No wheezes,  no rhonchi, and no crackles.  CARDIOVASCULAR:  Regular rate and rhythm.  No murmurs, rubs or gallops.  ABDOMEN:  Soft, nontender, and nondistended.  Positive bowel sounds.  EXTREMITIES:  No clubbing, cyanosis, or edema.  Left lower extremity in  a knee immobilizer.  NEUROLOGICAL:  The patient is alert and oriented x3.   Cranial nerves II  through XII are grossly intact.  No focal deficits, and gait not tested.   LABORATORY DATA:  Sodium 136, potassium 4.1, chloride 99, bicarb 30, BUN  7, creatinine 0.64, glucose of 106, calcium of 8.4.  PTT 35,  PT 13.8,  and INR 1.0.  CBC, white count 7.3, hemoglobin 8.8, platelets 157,  hematocrit 25.6.  Hep panel negative.  D-dimer 3.43, prior D-dimer  inhibitor in November 2007 was 0.56.   X-rays of the left femur:  Good position and alignment following left  total knee arthroplasty.  No acute findings.  Plain films of the left  hip, moderate degenerative changes of the SI joint.  No acute findings.  EKG:  Normal sinus rhythm, left axis deviation.   ASSESSMENT AND PLAN:  [name] is a 70 year old female with  history of hypertension, osteoarthritis, admitted for a left total knee  replacement with history of also chronic shortness of breath with  complaints of shortness of breath   Shortness of breath.  The patient has shortness of breath, is unchanged  from baseline.  Differential includes pulmonary etiologies such as  pulmonary embolus versus a pneumonia, which is unlikely with a normal  white count, and no cough and no symptoms of infection versus  obstructive lung disease versus cardiac etiology, which is unlikely as  the patient has a negative stress test in the past, is asymptomatic, and  denies any chest pain versus symptomatic anemia.  The patient did have a  hemoglobin of 12.6 on January 16, 2008, and a hemoglobin of 8.8 on January 23, 2008.  The patient did have an elevated prior D-dimer in the past,  was 0.56 in November 2007.  The patient's EKG is normal.  We will check  a chest CT to rule out a PE, will check a CBC in the morning to follow  up on the patient's hemoglobin, and the patient may need a transfusion.  The patient may likely also need pulmonary function tests if not done as  an outpatient and will monitor and follow patient.   It  was a pleasure taking care of [name].       Ramiro Harvest, MD  Electronically Signed     DT/MEDQ  D:  01/23/2008  T:  01/24/2008  Job:  [id number]   cc:   Deirdre Peer. Nehemiah Settle, M.D.  Dyke Brackett, M.D.

## 2011-02-16 NOTE — H&P (Signed)
NAME:  ALTHIA, EGOLF              ACCOUNT NO.:  [id number]   MEDICAL RECORD NO.:  [id number]          PATIENT TYPE:  INP   LOCATION:  3104                         FACILITY:  [facility]   PHYSICIAN:  Marlan Palau, M.D.  DATE OF BIRTH:  Nov 29, 1940   DATE OF ADMISSION:  04/11/2009  DATE OF DISCHARGE:                              HISTORY & PHYSICAL   HISTORY OF PRESENT ILLNESS:  Gina Parker is a 70 year old black  female born on June 20, 1941, with a history of hypertension.  This  patient comes to [facility] at this point for an evaluation of  problem with sudden onset of headache, left gaze preference, and right  hemiparesis.  The patient was feeling well until about 6:30 p.m.  Some  of the other family members were having an argument and the patient got  upset.  The patient then had onset of the headache and within 15 minutes  began getting weak and eventually slumped over.  911 was called, and the  patient was brought in as a code stroke.  The patient had to be  intubated as she began vomiting and had decreased level of  consciousness.  The patient underwent a CT scan of the brain showing a  moderate-sized left frontal intracranial hemorrhage with  interventricular extension and subarachnoid blood as well.  Intraventricular hemorrhage extends all the down into the fourth  ventricle.  The patient has been sedated and intubated at the time of  neurologic evaluation.  NIH stroke scale score is 23.  The patient was  not felt to be a tPA candidate due to the intracranial hemorrhage.   Past medical history is significant for:  1. History of left frontal intracranial hemorrhage as above with      interventricular extension.  2. Obesity.  3. Left total knee replacement.  4. Hypertension.  5. Organic heart disease.  6. Dyslipidemia.  7. Osteoporosis.  8. Degenerative arthritis.   Medications are unknown.  The patient is on Norvasc 10 mg daily, but  from previous medical  records the patient was also on Coreg 6.25 mg  twice daily, Neurontin 600 mg 3 times daily, simvastatin 20 mg daily,  Dyazide 37.5/25 mg tablets daily, Colace 100 mg twice daily, and Fosamax  70 mg once a week.   The patient has no known allergies.   Does not smoke or drink.   SOCIAL HISTORY:  This patient lives in the [place],  area.  He is not married, has 3 children who are alive and well.  The  patient has not worked.   Family medical history notable that mother died with diabetes.  Father  had prostate cancer.  The patient has 5 brothers, 3 sisters, one sister  passed away with a brain aneurysm.  There is a history of hypertension  in the family.   REVIEW OF SYSTEMS:  Cannot be obtained at this time.   PHYSICAL EXAMINATION:  VITALS:  Blood pressure is 131/89, heart rate 99,  temperature afebrile.  GENERAL:  The patient is an intubated, comatose, moderately obese  patient.  Pupils are  2 mm, trace reactive.  Discs are difficult to  visualize.  The patient does doll slightly.  Corneal reflexes are  present.  NECK:  Supple.  No carotid bruits are noted.  BREAST:  Clear.  CARDIOVASCULAR:  Regular rate and rhythm.  No obvious murmurs or rubs  noted.  EXTREMITIES:  Notable for 1+ edema.  ABDOMEN:  Positive bowel sounds.  No organomegaly or tenderness noted.  NEUROLOGIC:  Cranial nerves difficult to evaluate.  The patient does  have corneal bilaterally.  Does have doll's eyes.  Pupils are symmetric.  The patient is intubated, nonverbal, comatose.  EXTREMITIES:  Flaccid on all fours.  The patient does have dorsiflexion  of the right foot only with pain stimulation.  Otherwise, no response is  seen on any of the extremities.  The patient has decreased motor tone in  all fours.  The patient cannot perform cerebellar testing, cannot be  ambulated.  Deep tendon reflexes are depressed.  Toes neutral  bilaterally.   Laboratory values are notable for a white count of  7.8, hemoglobin of  11.5, hematocrit 34.1, MCV of 89.2, platelets of 184, INR of 1.1.  Sodium 139, potassium 2.7, chloride of 103, CO2 25, glucose of 178, BUN  of 9, creatinine 0.89, total bili 1.3, alk phosphatase of 70, SGOT 25,  SGPT of 16, total protein 8.1, albumin of 4.2, calcium of 9.1, CK of  222, MB fraction 1.6, troponin I 0.01.   Chest x-ray shows no acute cardiopulmonary process.  CT of the head is  as above.   IMPRESSION:  1. Left frontal intracranial hemorrhage.  2. History of hypertension.   The CT of the head shows a moderate-sized intracranial hemorrhage in the  left frontal lobe.  This is an unusual location for a primary  hypertensive bleed.  Need to rule out the possibility of aneurysm or a  mass underlying the hemorrhage.  This patient does have subarachnoid  blood as well as interventricular blood.  The patient will undergo a CT  angiogram to look for aneurysm.  Neurosurgery has been called and  notified.  Critical care medicine consult was been obtained.  Blood  pressure management will be performed, and the patient will be admitted  to the intensive care unit with the intracranial hemorrhage admission  orders.  The patient will be followed closely.  The patient was given  Dilantin prophylactically due to extensive subarachnoid blood.  We will  follow levels overtime.      Marlan Palau, M.D.  Electronically Signed     CKW/MEDQ  D:  04/11/2009  T:  04/12/2009  Job:  [id number]   cc:   [facility]

## 2011-02-16 NOTE — Op Note (Signed)
NAME:  Gina Parker, Gina Parker              ACCOUNT NO.:  [id number]   MEDICAL RECORD NO.:  [id number]          PATIENT TYPE:  INP   LOCATION:  3104                         FACILITY:  [facility]   PHYSICIAN:  Kalman Shan, MD   DATE OF BIRTH:  04-09-1941   DATE OF PROCEDURE:  DATE OF DISCHARGE:                               OPERATIVE REPORT   TYPE OF PROCEDURE:  Percutaneous dilatation of tracheostomy.   INDICATION:  Neurologic injury with aneurysmal bleed, anticipate  prolonged recovery, and prolonged ventilator dependence.   CONSENT:  The consent was obtained on April 16, 2009, 2 days before the  procedure from the daughter and from the son-in-law at the bedside.  I,  Dr. Marchelle Gearing, had an extensive discussion with them about the  indication which is for prolonged mechanical ventilation and risks of  the procedure.  The risks included in the acute phase bleeding,  infection, pneumothorax and in the chronic phase strictures, ulcers,  recurrent bronchitis, and cosmetic disfigurement.  But this was the best  way to proceed with prolonged mechanical ventilation and given the  anticipation for prolonged recovery a tracheostomy was absolutely  indicated.  Family agreed and wished to proceed.   PREPROCEDURE ASSESSMENT:  The patient was evaluated in the morning of  the procedure.  Her hemodynamics were stable.  Her RASS sedation score  was 0 and she was able to nod and enhance simple questions.  Her  muscular power was 1-2/5.  Respiratory exam showed some crackles.  Abdomen was soft with normal bowel sounds.  Vital signs were stable  including blood pressure and heart rate.  Lab values were essentially  normal except for a mild decrease in calcium and phosphorus and these  were ordered to be repleted, but specifically hemoglobin was 10,  platelet count was 208,000, and an INR of 1.1.  Then, her neck anatomy  was examined in detail, was felt to be safe for percutaneous dilatation  of tracheostomy  and wished to proceed.   PREPROCEDURE SEDATION PLAN:  The sedation plan was to give sedation  initially with fentanyl and Versed and subsequently anesthesia with  Diprivan and etomidate and possibly the use of paralytics for the  procedure.   OPERATORS:  1. Kalman Shan, MD, under the proctorship of Nelda Bucks,      MD.  2. Bronchoscopist is Dr. Nelda Bucks, MD.   PROCEDURE NOTE:  After determining it was okay to proceed with the  procedure, the patient was placed in the flat position on the bed.  N.p.o. status was confirmed.  Her Lovenox was held.  Blood sugars were  normal.  Vital signs stability was ensured.  A roll of towel was placed  between her shoulder blades in order to extend her neck.  The neck was  proceeded to be stiff secondary to intracranial hemorrhage.  For this,  fentanyl boluses and Versed boluses were given.  The neck stiffness  improved, but she then required Diprivan in order to alleviate the neck  stiffness and also a dose of vecuronium.  Any attempt to position of  flat was resulting  in hiccups and the stiff neck was interfering with  appropriate neck anatomy that she required Diprivan and vecuronium.  Prior to all this, the patient was oxygenated with 100% FiO2 for over 15  minutes prior to the procedure.   Then following complete barrier precautions, she was prepped with  chlorhexidine.  Six swabs were applied to her neck and her clavicular  areas extensively.  Under complete barrier precautions, lidocaine  anesthesia was given in her anterior neck after confirming the right  thyroid and cricoid cartilages.  The lidocaine was applied around the  second and third tracheal ring area in the midline and 2 fingerbreadths  above the sternal notch.  This lidocaine was 1% lidocaine with  epinephrine that came with [employer] kit.  Then, a 1.5-cm scalpel  superficial incision was made.  Blunt dissection was made carefully to  ensure all soft  tissue was dissected.  Some mild oozing was noted from  the capillaries of the skin.  This was tamponaded with gauze.  After  that, there was a clean field.  This allowed blunt dissection.  In the  meanwhile, the bronchoscopy gave good visualization.  The trachea was  then grasped with my right hand firmly and following this the Angiocath  needle was inserted into the trachea and was aspirated and under direct  vision, care was taken not to penetrate the posterior tracheal wall.  The guidewire was then inserted through the Angiocath using the  Seldinger technique.  Following this, a punch dilator was applied and  the tracheal wall dilated very easily.  The intercartilaginous space  between the tracheal rings penetrated very easily.  Subsequently, the  curved dilator was used and further dilatation of the intercartilaginous  space between the tracheal ring was made.  This was then removed and a  size 6 tracheostomy was snapped in place without any problems.  No  bleeding was seen.  Following this, the bronchoscopist, Dr. Tyson Alias  then introduced the bronchoscope.  Following this, the patient was  connected to the ventilator and the bronchoscopist, Dr. Tyson Alias  inserted the bronchoscope through the tracheostomy, made a detailed  upper airway exam including the carina.  No bleeding was noticed and  then the bronchoscope was removed.  Vital signs were stable throughout  the procedure.  The tracheostomy was then sutured using the available  suture kit.  Three sutures were applied, and then finally tracheostomy  was strapped with the Velcro belt.   COMMENTS:  A time-out was done before the procedure.   POSTPROCEDURE PLAN:  Standard postprocedure orders were filled out.  Chest x-ray was done.  The tracheostomy was in place.  No pneumothorax.   Note, during the final suture, Dr. Marchelle Gearing had a small needle stick  injury with the scraping of the left middle index finger dorsum of the   skin.  This was then reported to [employer].      Kalman Shan, MD  Electronically Signed     MR/MEDQ  D:  04/18/2009  T:  04/18/2009  Job:  [id number]   cc:   Nelda Bucks, MD

## 2011-02-16 NOTE — Discharge Summary (Signed)
NAME:  Gina Parker, Gina Parker              ACCOUNT NO.:  [id number]   MEDICAL RECORD NO.:  [id number]           PATIENT TYPE:   LOCATION:                                 FACILITY:   PHYSICIAN:  Donalee Citrin, M.D.        DATE OF BIRTH:  [birth year]   DATE OF ADMISSION:  05/01/2009  DATE OF DISCHARGE:  05/09/2009                               DISCHARGE SUMMARY   ADMITTING DIAGNOSES:  1. Intracerebral hemorrhage and subarachnoid hemorrhage.  2. Left frontal arteriovenous malformation.   DISCHARGE DIAGNOSES:  1. Intracerebral hemorrhage and subarachnoid hemorrhage.  2. Hydrocephalus.  3. Left frontal arteriovenous malformation.   PROCEDURE:  1. Left-sided pterional craniotomy for evacuation of a left      frontoparietal intracerebral hematoma secondary to a left frontal      AVM.  2. Tracheostomy.  3. PEG tube placement.  4. Ventriculoperitoneal shunt placement.   HISTORY OF PRESENT ILLNESS:  The patient is a 70 year old female who  presented with altered mental status to the emergency department where a  CT scan showed a large frontal headache and exam revealed declining  level of consciousness and right hemiparesis.  The CT scan showed a  large frontoparietal hematoma.  The patient was removed and was taken to  the operating room where she was found to have a left frontal AVM and  intraoperatively this was resected.  Postoperatively, the patient went  to the ICU was observed over the next several days.  Neurologically  continued to have waxing and waning mental status but would open eyes to  commands, to name and follow some simple commands.  The patient was  maintained with an endotracheal tube.  This ultimately ended up being  converted over to a tracheostomy.  Critical Care Medicine followed the  patient to managing her acute respiratory failure due to neurogenic  causes, however, the patient did subsequently develop a pneumonia and  was placed on IV antibiotics.  During her  hospitalization, she continued  to have waxing and waning of mental status.  Followup CT scan showed  adequate evacuation of the hematoma.  Neurologic exam continued to  slowly improve with following some occasional commands.  The patient was  weaned progressively off of the ventilator and eventually after the  tracheostomy was placed and a PEG tube was placed, the patient was able  to be converted over to a trache collar and at that point stable and was  transferred to the floor.  On the floor, the patient did experience some  slow but progressive decline in mental status.  It was unclear initially  whether this was due to resolving pneumonia.  However, followup CT scan  revealed some evidence of hydrocephalus and the patient was taken to the  OR and underwent ventriculoperitoneal shunt placement.  The patient  tolerated that procedure well and mental status continued to improve  after the shunt placement.  Her respiratory status continued to improve  progressively and she was maintained on Avelox, as well as IV vancomycin  at the time of discharge and transferred to the skilled nursing  facility.  The patient's neurologic exam was stable.  She would awaken  and follow some simple commands.  She still had a right hemiparesis and  followup CT scan had been stable and the patient was maintained on her  feeds through the PEG tube and had no fevers for several days and  laboratory data showed a resolving and stable  electrolytes and  declining white count.   DISCHARGE MEDICATIONS:  1. Clonidine 0.2 TTS patch changed to everyday.  2. She was also on hydrochlorothiazide 25 daily.  3. Norvasc 10 mg p.o. daily.  4. Protonix 40 q.12.  5. Lovenox p.r.n. for DVT prophylaxis.  6. Keppra 500 b.i.d.  7. Colace 100 b.i.d.  8. Sodium chloride 2 grams p.o. q.6.  9. Insulin sliding scale.  10.Reglan 10 q.8 IV p.r.n. for nausea and vomiting.  11.P.r.n. morphine and labetalol for hypertension.   12.Vicodin for pain.  13.The patient was off all of her IV antibiotics and p.o. antibiotics      at this time.   FOLLOWUP:  The patient should followup with Dr. Gerlene Fee in approximately  1 week for a staple removal.  The patient should followup with her  pulmonologist and general medical doctor in a couple of weeks for her  blood pressure management.           ______________________________  Donalee Citrin, M.D.     GC/MEDQ  D:  05/08/2009  T:  05/09/2009  Job:  [id number]

## 2011-02-16 NOTE — Op Note (Signed)
NAME:  Gina Parker, Gina Parker              ACCOUNT NO.:  [id number]   MEDICAL RECORD NO.:  [id number]          PATIENT TYPE:  INP   LOCATION:  3104                         FACILITY:  [facility]   PHYSICIAN:  Reinaldo Meeker, M.D. 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   PROCEDURES:  Left frontal craniotomy with evacuation of intraparenchymal  hematoma and resection of arteriovenous malformation.   SURGEON:  Reinaldo Meeker, MD   PROCEDURE IN DETAIL:  After being placed in a supine position with the  head turned towards the right, the patient's left scalp was shaved,  prepped and draped in the usual sterile fashion.  Curvilinear incision  was made starting in the temporal region just superior to the ear  heading up towards the midline and then curving anteriorly towards the  hairline.  The flap was reflected anteriorly but we stayed superficial  to the temporal fascia.  The muscle was then stripped inferiorly from  its superior insertion.  A 3-hole craniotomy in the left frontal region  was then fashioned without difficulty.  We then reflected the dura  posteroinferiorly.  An obvious collection of abnormal blood vessels  could be seen on the surface consistent with arteriovenous malformation.  We made a corticectomy inferior and posterior to this giving [place] a good  amount of room to start coming around the vascular abnormality.  Blood  vessels were coagulating on the inferior and anterior margin.  As we  went deeper, we entered the hematoma cavity and as we evacuated some of  the hematoma, we were able to see the arteriovenous malformation without  difficulty.  The deep edges were dissected free of any other tissue  by  the hematoma.  As we came across the superior edge, few large feeding  vessels and draining vessels were identified.  These were dissected free  of brain tissue and then clipped with hemoclips.  We then cut them  between the hemoclips.  We then removed the entire tissue of the  arteriovenous malformation en bloc without difficulty.  We were then  looking at the residual hematoma cavity.  Large amounts of hematoma had  already been removed, but there was some residual and this was removed  as well.  At this time, any bleeding on the surface of the brain was  coagulated, but there was a very little bleeding noted.  The wound was  then irrigated copiously.  The resection bed was lined with Surgicel and  so no bleeding was noted.  The dura was reapproximated as well as  possible.  There was some shredding of the dura due to the thin nature  of it in this elderly woman in the frontal region.  We, therefore,  placed Duragen over the dural defects.  We then placed Gelfoam around  the edges of the craniotomy and no bleeding could be noted.  The bone  flap was then reattached with 3 straight Lorenz plates with 2 screws  each.  A subgaleal drain was then left and brought through a separate  stab incision.  The temporal fascia was then reapproximated superiorly  without difficulty.  The wound was then closed with interrupted Vicryl  on the galea and staples on the skin.  A sterile dressing was then  applied, and the patient was taken to Neurosurgical Intensive Care Unit  in stable but critical condition.          ______________________________  Reinaldo Meeker, M.D.    ROK/MEDQ  D:  04/12/2009  T:  04/12/2009  Job:  [id number]

## 2011-02-16 NOTE — Discharge Summary (Signed)
NAME:  Gina Parker, Gina Parker              ACCOUNT NO.:  [id number]   MEDICAL RECORD NO.:  [id number]          PATIENT TYPE:  INP   LOCATION:  3013                         FACILITY:  [facility]   PHYSICIAN:  Reinaldo Meeker, M.D. DATE OF BIRTH:  1941-09-15   DATE OF ADMISSION:  04/11/2009  DATE OF DISCHARGE:  05/09/2009                               DISCHARGE SUMMARY   PRIMARY DIAGNOSIS:  Left frontal arteriovenous malformation.   HISTORY:  [name] is a 70 year old female with sudden onset of  decreased level of consciousness on the day of admission.  Code stroke  was called and a CT scan of the brain was done, which showed a large  left frontal hematoma.  We did a CT angio, which showed no evidence of  an aneurysm, but did show a left frontal arteriovenous malformation.  On  April 11, 2009, the patient was taken to the operating room where she  underwent the craniotomy for evacuation of the hematoma and resection of  the AVM.  She tolerated this without difficulty.  She remained fairly  comatose and underwent PEG tube placement and tracheostomy.  She was  slowly able to improve neurologically.  At that point where she was  moulding some words and following some complex commands.  Around April 27, 2009, however, she began to have some worsening neurologically.  She  was evaluated with some lab work, which did show some mild hyponatremia,  but a CT scan of the brain showed a marked increased in ventricular size  compared to the scanned from a few days earlier consistent with the  development of communicating hydrocephalus.  Options were discussed with  the family and therefore, on May 01, 2009, the patient was taken to the  operating room where she underwent a right occipital  ventriculoperitoneal shunt.  She tolerated the procedure well, had  marked improvement in neurologic status thereafter.  She was then able  to follow commands, was awake and alert.  She was moulding some words  and was  working with therapy.  Physical Medicine Rehabilitation were  consulted and they felt that due to the family's responsibilities in the  work place and the inability for someone to give her 24-hour care.  She  would need a skilled nursing facility.  On May 09, 2009, she was ready  for release to the skilled nursing facility and was therefore discharged  at that time.  Her condition was markedly improved versus discharge.  Her medications at that time included Catapres patch weekly,  hydrochlorothiazide 25 mg p.o. daily, Norvasc 10 mg p.o. daily, Protonix  40 mg p.o. b.i.d., Keppra 500 mg per G tube b.i.d.  She was on Osmolite  feedings.  She is also going to take [employer] for pain.           ______________________________  Reinaldo Meeker, M.D.     ROK/MEDQ  D:  05/09/2009  T:  05/09/2009  Job:  [id number]

## 2011-02-16 NOTE — Op Note (Signed)
NAME:  Gina Parker, Gina Parker              ACCOUNT NO.:  [id number]   MEDICAL RECORD NO.:  [id number]          PATIENT TYPE:  INP   LOCATION:  3107                         FACILITY:  [facility]   PHYSICIAN:  Reinaldo Meeker, M.D. DATE OF BIRTH:  05/11/1941   DATE OF PROCEDURE:  05/01/2009  DATE OF DISCHARGE:                               OPERATIVE REPORT   PREOPERATIVE DIAGNOSIS:  Hydrocephalus.   POSTOPERATIVE DIAGNOSIS:  Hydrocephalus.   PROCEDURE:  Right occipitio-parietal ventriculoperitoneal shunt.   SURGEON:  Reinaldo Meeker, MD   PROCEDURE IN DETAIL:  After being placed in the supine position with the  head turned towards the left, the patient's scalp, neck, chest, and  abdomen were prepped and draped in the usual sterile fashion.  A  transverse incision was made in the right middle quadrant and carried  down to the anterior rectus sheath, which was incised transversely.  Muscle-splitting incision was carried out until we identified the  posterior rectus sheath.  This was opened along with peritoneum.  We  were able to visualize the peritoneal cavity, which was probed as well  and found to be correct.  Linear incision was then made in the right  parietal region.  Any bleeding was controlled with unipolar coagulation.  A single bur hole was made, any residual bone curetted away.  The dura  was coagulated and opened in a cruciate fashion.  The leaves of the dura  were coagulated with bipolar coagulation.  At this time, the shunt  passer was passed from the abdominal incision to the cranial incision  without difficulty.  We then passed the shunt tubing down from the  proximal-distal direction.  We were able to tap the ventricle without  difficulty and was encountered approximately 5 cm of depth.  We,  however, passed the catheter more to an 8 cm depth as it easily tracked  along with the ventricle.  We then saw free flow of fluid under marked  pressure.  This was then secured to the  valve and distal catheter and  secured with a single stitch.  We then saw fluid dripping out the distal  end.  The distal end was then placed into the peritoneum, and the  posterior rectus sheath was closed with a purse-string suture.  The  wounds were then irrigated copiously and closed in multiple layers of  Vicryl on the abdomen, on the fascia, subcutaneous and subcuticular  tissues, and staples were placed on the skin.  An inverted galeal stitch  was placed on the cranial incision.  Staples were placed on the skin.  Sterile dressings were then applied.  The patient was taken to recovery  room in stable condition.           ______________________________  Reinaldo Meeker, M.D.     ROK/MEDQ  D:  05/01/2009  T:  05/02/2009  Job:  [id number]

## 2011-02-16 NOTE — Op Note (Signed)
NAME:  Gina Parker, Gina Parker              ACCOUNT NO.:  [id number]   MEDICAL RECORD NO.:  [id number]          PATIENT TYPE:  INP   LOCATION:  5009                         FACILITY:  [facility]   PHYSICIAN:  Dyke Brackett, M.D.    DATE OF BIRTH:  Jan 03, 1941   DATE OF PROCEDURE:  01/19/2008  DATE OF DISCHARGE:                               OPERATIVE REPORT   INDICATIONS:  A 70 year old with severe osteoarthritis, left knee,  thought to be amenable to inpatient hospitalization knee replacement.   PREOPERATIVE DIAGNOSIS:  Severe osteoarthritis and varus deformity, left  knee.   POSTOPERATIVE DIAGNOSIS:  Severe osteoarthritis and varus deformity,  left knee.   OPERATION:  Left total knee replacement (DePuy cemented LCS standard  femur, patella, 2.5 tibia with a 12.5-mm bearing).   SURGEON:  Dyke Brackett, MD   ASSISTANT:  Sharol Given, PA  Oris Drone Petrarca, PA-C   ANESTHESIA:  General.   TOURNIQUET TIME:  1 hour 30 minutes.   PRINCIPAL PROCEDURE:  Sterile prep and drape, exsanguination of the leg,  and placed in 350 mmHg.  A standard skin incision medial parapatellar  approach to the knee made.  Severe osteoarthritis noted with a varus  deformity release of the medial saggital relative to stripping of the  medial compartment and removal of osteophytes on the tibia and femur.  It was cut with a tibial guide cut 5 mm below the most diseased medial  compartment.  The flexion gap was created with a distal anterior-  posterior femoral cut with a 12.5-mm bearing eventually used.  This was  followed by a 4-degree distal valgus cut and with the finishing guide.  Excess remnants of the menisci were removed.  Complete recess of the PCL  and ACL was done as well.   Attention was then directed to the tibia.  Keel hole was cut for the  tibia with a size-2.5 tibia used.  Trial tibia was placed followed by  trial femur and reduction carried out.  Again, 10 and 12.5 were trialed  and 12.5 allowed  full extension with excellent stability in flexion and  varus and valgus instability as well.  The patella was cut leaving 14 mm  of native patella, 3-peg patella trial, and then final trials done.  This was followed by removal of all the trials.  Irrigation of the bony  surfaces was carried out as well.  The final prosthesis was inserted  with 2 batches of antibiotic-impregnated cement with 1.5 g of Zinacef  per batch using antibiotic-impregnated cement.  Tibia was cemented  followed by femur patella.  Excess cement was removed from the  prosthesis.  Trial peg and trial bearing was placed.  Cement was allowed  to harden with the knee in its extension.  Excess cement was removed  prior to releasing the tourniquet.  FloSeal was placed around the knee  as well as injecting the capsule with Marcaine with epinephrine.  Tourniquet was released.  No excessive bleeding was noted.  Final  bearing was placed followed by again trialing again in full extension,  excellent range of motion  with no tendency to bearing spinout noted.  Closure  was effected of the capsule with #1 Ethibond and 0 and 2-0 Vicryl with  skin clips on the skin.  Final compression sterile dressing applied.  Hemovac drain was placed, I think, superolaterally and taken to recovery  room in stable condition.      Dyke Brackett, M.D.  Electronically Signed     WDC/MEDQ  D:  01/19/2008  T:  01/20/2008  Job:  [id number]

## 2011-02-19 NOTE — Consult Note (Signed)
NAME:  Gina Parker, Gina Parker              ACCOUNT NO.:  [id number]   MEDICAL RECORD NO.:  [id number]          PATIENT TYPE:  INP   LOCATION:  4729                         FACILITY:  [facility]   PHYSICIAN:  Corky Crafts, MDDATE OF BIRTH:  [birth year]   DATE OF CONSULTATION:  08/06/2006  DATE OF DISCHARGE:                                   CONSULTATION   REFERRING PHYSICIAN:  Kela Millin, M.D.   PRIMARY CARE PHYSICIAN:  None.   REASON FOR CONSULTATION:  Chest pain.   HISTORY OF PRESENT ILLNESS:  The patient is a 70 year old who developed  sharp left-sided chest pain yesterday. It was worse with a deep breath.  She  had a chest CT which was negative for a PE. Cardiac markers have been  negative.  The patient had a ECG which did not show any evidence of  ischemia.  She continues to have some mild left-sided chest pain. It is  worse with palpation. It is worse with a deep breath.  She fell last week  after tripping on some stairs.  She does not recall any focal injury to that  area, but she is unsure. Her overall pain is significantly improved at this  point.   PAST MEDICAL HISTORY:  1. Question of prior irregular heart rhythm.  2. Hypertension.  3. Arthritis.  4. Question of aneurysm at the left knee.  The patient states that she was      supposed to have a knee replacement, but she did not want it.   PAST SURGICAL HISTORY:  None.   ALLERGIES:  No known drug allergies.   MEDICATIONS:  1. Maxzide 37.5/25 on tablet daily.  2. Ambien.  3. Gabapentin.  4. Amlodipine 10 mg a day.  5 . Quinine.  1. Coreg 6.25 mg b.i.d.   FAMILY HISTORY:  No early coronary artery disease.   SOCIAL HISTORY:  The patient does not smoke.  She does not drink.  She  denies use of any illegal drugs.  She is retired on disability.   REVIEW OF SYSTEMS:  She has knee pain, chest pain as described above.  There  is some shortness of breath with exertion and occasional lightheadedness.  All other  systems negative.  No weight loss.  No nausea or vomiting. No  change in appetite.   PHYSICAL EXAMINATION:  VITAL SIGNS:  Blood pressure 110/73, heart rate 55.  She is afebrile.  GENERAL:  The patient is awake and alert.  No apparent distress.  HEAD: Normocephalic, atraumatic.  EYES:  Extraocular is intact.  NECK:  No carotid bruits.  No JVD.  CARDIOVASCULAR:  Regular rate and rhythm, S1-S2.  LUNGS:  Clear to auscultation bilaterally.  ABDOMEN:  Soft, nontender, nondistended.  EXTREMITIES:  Showed no edema, 2+ dorsalis pedis pulses bilaterally.  NEUROLOGIC: No focal weakness.  SKIN:  No rash.  BACK: No kyphosis or scoliosis.  PSYCH: Normal mood and affect.   LABORATORY DATA:  Troponin negative x3.  Point-of-care test in the emergency  room is now negative x2 on the floor.   EKG shows normal sinus rhythm with no  ST or T-wave changes.   Creatinine 0.5. Hematocrit 34.   ASSESSMENT/PLAN:  A 70 year old with no known coronary artery disease.  She  has not had a prior stress test or catheterization by her report.   PLAN:  1. Cardiac: Continue to rule out for myocardial infarction.  She has one      more set of enzymes pending.  It his is negative, consider discharging      the patient will followup including an outpatient stress test if her      symptoms are improved.  2. Otherwise may do an adenosine Cardiolite in a.m. if enzymes are      negative, but her symptoms are not resolved.  3. If her enzymes become positive, I would consider heart catheterization.  4. A doubt a cardiac etiology based on this history and exam.  5. Continue Coreg for her prior irregular heart rhythm.  6. Will follow telemetry for any arrhythmia.  7. Continue amlodipine and Maxzide for high blood pressure which is well      controlled now.  8. I will follow the patient.      Corky Crafts, MD  Electronically Signed     JSV/MEDQ  D:  08/06/2006  T:  08/07/2006  Job:  [id number]   cc:   Kela Millin, M.D.

## 2011-02-19 NOTE — H&P (Signed)
NAME:  Gina Parker, Gina Parker              ACCOUNT NO.:  [id number]   MEDICAL RECORD NO.:  [id number]          PATIENT TYPE:  INP   LOCATION:  1826                         FACILITY:  [facility]   PHYSICIAN:  Jackie Plum, M.D.DATE OF BIRTH:  24-Apr-1941   DATE OF ADMISSION:  08/05/2006  DATE OF DISCHARGE:                                HISTORY & PHYSICAL   CHIEF COMPLAINT:  Chest pain as per the history of present illness.   HISTORY OF THE PRESENT ILLNESS:  This patient is a 70 year old African-  American lady with a history of hypertension heart condition and leg  cramps.  She currently lives in [place] having just moved here [place] with her daughter.  She presented to the ED due to the acute onset left-  sided chest pain/submammary chest pain, which is nonradiating.  It is  moderate and sharp in character.  She states she has been having some  diaphoresis and shortness of breath on presentation to the ED.  In the  emergency room the patient was given 10 mg of IV morphine long with aspirin  and nitroglycerin with some moderate relief.  On account of the acute onset  of symptoms the patient  had a D-dimer, which was elevated and therefore  follow up CT angiogram was done, which ruled out pulmonary embolus. She had  s 12-lead electrocardiogram, which did not reveal any acute ST wave changes.  Accord ng to emergency department chart I cannot find an EKG in the  patient's  records at the moment.   The patient is admitted to the hospital for further evaluation of chest  pain.   The patient denies any fever or chills.  She does not have any palpation,  orthopnea or PND. No nausea, vomiting, diarrhea, constipation, abdominal  pain, ir heartburn,.   PAST MEDICAL HISTORY:  The patient's past medical history is noted above.   MEDICATIONS:  The patient's current medication list includes amlodipine,  Coreg, gabapentin, hydrochlorothiazide, quinine sulfate, and Ambien.   ALLERGIES:  The  patient does not have any medication allergies.   FAMILY HISTORY:  The family history is positive for diabetes mellitus.   SOCIAL HISTORY:  The patient social history is as noted above.  The patient  does not smoke cigarettes nor does she drink alcohol.   REVIEW OF SYSTEMS:  The review of systems is noted above otherwise it is  negative.   PHYSICAL EXAMINATION:  VITAL SIGNS:  Blood pressure 125/70, pulse 54,  respirations 20, temperature 97.4 degrees Fahrenheit, and O2 saturation 97%.  GENERAL APPEARANCE:  The patient is well-developed, well-nourished and in no  acute distress.  HEENT:  Normocephalic and atraumatic.  Pupils are equal, round and react to  light. Extraocular muscles are intact.  Oropharynx is moist.  NECK:  The neck is supple.  There is no JVD.  CHEST:  The patient has reproducible left-sided chest wall tenderness  LUNGS:  The lungs re clear to auscultation bilaterally, anteriorly.  There  are some decreased breath sounds at the bases.  HEART:  Cardiac exam is regular with occasional extrasystole.  No  gross  abnormalities.  ABDOMEN:  The abdomen is obese.  Bowel sounds present.  Nontender.  EXTREMITIES:  The extremities have trace edema.  No cyanosis.  NEUROLOGIC:  The neur logical examination is nonfocal.   LABORATORY DATA:  The labs are reviewed. Hemoglobin 11.4, hematocrit 34.0,  WBC count of 5.1, MCV 89.5, and platelet count 185,000.  Point of care  cardiac markers are negative.  D-dimer is elevated at 0.56.  Sodium 139,  potassium 3.7, chloride 106, CO2 27,  glucose 102, BUN 6, creatinine 0.5,  and calcium 8.7.  X-ray of the chest shows  bibasilar atelectasis. CT scan  was negative for any pulmonary embolism.   IMPRESSION:  Atypical chest pain.  The patient has cardiomegaly with  possible vascular congestion. The patient states she has a heart  condition.  She may had some for cardiomyopathy.   PLAN:  We will admit her for  serial cardiac enzymes. We will  check a 12-  lead electrocardiogram and a two-dimensional echocardiogram.  We will check  a B-natriuretic peptide as well.  We will provide some pain relief  with as  needed  morphine sulfate.  We will continue her aspirin.      Jackie Plum, M.D.  Electronically Signed     GO/MEDQ  D:  08/06/2006  T:  08/06/2006  Job:  [id number]

## 2011-06-29 LAB — CBC
HCT: 24.9 — ABNORMAL LOW
HCT: 26.2 — ABNORMAL LOW
Hemoglobin: 8.6 — ABNORMAL LOW
Hemoglobin: 8.8 — ABNORMAL LOW
Hemoglobin: 9 — ABNORMAL LOW
MCHC: 33.8
MCHC: 34
MCHC: 34.4
MCHC: 34.5
MCHC: 34.9
MCV: 87.9
MCV: 88
MCV: 89
Platelets: 155
RBC: 2.82 — ABNORMAL LOW
RBC: 2.87 — ABNORMAL LOW
RBC: 2.98 — ABNORMAL LOW
RBC: 3.02 — ABNORMAL LOW
RBC: 3.22 — ABNORMAL LOW
RDW: 14.4
RDW: 14.1
RDW: 14.3
RDW: 14.3
WBC: 8
WBC: 9.8

## 2011-06-29 LAB — BASIC METABOLIC PANEL
BUN: 11
CO2: 27
CO2: 28
CO2: 29
CO2: 30
Calcium: 8.2 — ABNORMAL LOW
Calcium: 8.3 — ABNORMAL LOW
Calcium: 8.4
Chloride: 105
Chloride: 99
Creatinine, Ser: 0.64
Creatinine, Ser: 0.65
Creatinine, Ser: 0.74
GFR calc Af Amer: >60
GFR calc Af Amer: >60
GFR calc Af Amer: >60
GFR calc Af Amer: >60
GFR calc non Af Amer: >60
Glucose, Bld: 106 — ABNORMAL HIGH
Glucose, Bld: 129 — ABNORMAL HIGH
Potassium: 3.5
Sodium: 137
Sodium: 140

## 2011-06-29 LAB — PROTIME-INR
INR: 0.9
INR: 1
Prothrombin Time: 12.7
Prothrombin Time: 13.8

## 2011-06-29 LAB — COMPREHENSIVE METABOLIC PANEL
ALT: 35
AST: 27
BUN: 6
CO2: 29
Calcium: 9.8
Calcium: 8.6
Chloride: 97
Creatinine, Ser: 0.61
Creatinine, Ser: 0.74
GFR calc Af Amer: >60
GFR calc Af Amer: >60
GFR calc non Af Amer: >60
Glucose, Bld: 112 — ABNORMAL HIGH
Glucose, Bld: 128 — ABNORMAL HIGH
Sodium: 142
Total Bilirubin: 1
Total Protein: 7.7

## 2011-06-29 LAB — DIFFERENTIAL
Eosinophils Absolute: 0.1
Lymphocytes Relative: 37
Lymphs Abs: 2.1
Monocytes Relative: 7
Neutrophils Relative %: 53

## 2011-06-29 LAB — CROSSMATCH: ABO/RH(D): A POS

## 2011-06-29 LAB — URINE MICROSCOPIC-ADD ON

## 2011-06-29 LAB — URINALYSIS, ROUTINE W REFLEX MICROSCOPIC
Glucose, UA: NEGATIVE
Hgb urine dipstick: NEGATIVE
Ketones, ur: NEGATIVE
Protein, ur: NEGATIVE

## 2014-05-04 DEATH — deceased

## 2014-11-21 ENCOUNTER — Other Ambulatory Visit (HOSPITAL_COMMUNITY): Payer: Self-pay | Admitting: Orthopaedic Surgery

## 2014-12-04 NOTE — Pre-Procedure Instructions (Signed)
[name]  12/04/2014   Your procedure is scheduled on:  Tues, Mar 15 @ 12:55 PM  [facility] 10:45 AM.  Call this number if you have problems the morning of surgery: [phone]   Remember:   Do not eat food or drink liquids after midnight.   Take these medicines the morning of surgery with A SIP OF WATER: Keppra,Amlodipine,and Pepcid               No Goody's,BC's,Aleve,Aspirin,Ibuprofen,Fish Oil,or any Herbal Medications.   Do not wear jewelry, make-up or nail polish.  Do not wear lotions, powders, or perfumes. You may wear deodorant.  Do not shave 48 hours prior to surgery.   Do not bring valuables to the hospital.  [facility]                  for any belongings or valuables.               Contacts, dentures or bridgework may not be worn into surgery.  Leave suitcase in the car. After surgery it may be brought to your room.  For patients admitted to the hospital, discharge time is determined by your                treatment team.                   Special Instructions:  [place] - Preparing for Surgery  Before surgery, you can play an important role.  Because skin is not sterile, your skin needs to be as free of germs as possible.  You can reduce the number of germs on you skin by washing with CHG (chlorahexidine gluconate) soap before surgery.  CHG is an antiseptic cleaner which kills germs and bonds with the skin to continue killing germs even after washing.  Please DO NOT use if you have an allergy to CHG or antibacterial soaps.  If your skin becomes reddened/irritated stop using the CHG and inform your nurse when you arrive at Short Stay.  Do not shave (including legs and underarms) for at least 48 hours prior to the first CHG shower.  You may shave your face.  Please follow these instructions carefully:   1.  Shower with CHG Soap the night before surgery and the                                morning of Surgery.  2.  If  you choose to wash your hair, wash your hair first as usual with your       normal shampoo.  3.  After you shampoo, rinse your hair and body thoroughly to remove the                      Shampoo.  4.  Use CHG as you would any other liquid soap.  You can apply chg directly       to the skin and wash gently with scrungie or a clean washcloth.  5.  Apply the CHG Soap to your body ONLY FROM THE NECK DOWN.        Do not use on open wounds or open sores.  Avoid contact with your eyes,       ears, mouth and genitals (private parts).  Wash genitals (private parts)       with your  normal soap.  6.  Wash thoroughly, paying special attention to the area where your surgery        will be performed.  7.  Thoroughly rinse your body with warm water from the neck down.  8.  DO NOT shower/wash with your normal soap after using and rinsing off       the CHG Soap.  9.  Pat yourself dry with a clean towel.            10.  Wear clean pajamas.            11.  Place clean sheets on your bed the night of your first shower and do not        sleep with pets.  Day of Surgery  Do not apply any lotions/deoderants the morning of surgery.  Please wear clean clothes to the hospital/surgery center.     Please read over the following fact sheets that you were given: Pain Booklet, Coughing and Deep Breathing, MRSA Information and Surgical Site Infection Prevention

## 2014-12-05 ENCOUNTER — Encounter (HOSPITAL_COMMUNITY): Payer: Self-pay

## 2014-12-05 ENCOUNTER — Encounter (HOSPITAL_COMMUNITY)
Admission: RE | Admit: 2014-12-05 | Discharge: 2014-12-05 | Disposition: A | Discharge status: Home / Self Care | Payer: Medicare HMO | Source: Ambulatory Visit | Attending: Orthopaedic Surgery | Admitting: Orthopaedic Surgery

## 2014-12-05 DIAGNOSIS — E785 Hyperlipidemia, unspecified: Secondary | ICD-10-CM | POA: Insufficient documentation

## 2014-12-05 DIAGNOSIS — Z01812 Encounter for preprocedural laboratory examination: Secondary | ICD-10-CM | POA: Diagnosis not present

## 2014-12-05 DIAGNOSIS — I480 Paroxysmal atrial fibrillation: Secondary | ICD-10-CM | POA: Insufficient documentation

## 2014-12-05 DIAGNOSIS — K219 Gastro-esophageal reflux disease without esophagitis: Secondary | ICD-10-CM | POA: Insufficient documentation

## 2014-12-05 DIAGNOSIS — I10 Essential (primary) hypertension: Secondary | ICD-10-CM | POA: Insufficient documentation

## 2014-12-05 DIAGNOSIS — Z7982 Long term (current) use of aspirin: Secondary | ICD-10-CM | POA: Insufficient documentation

## 2014-12-05 DIAGNOSIS — M179 Osteoarthritis of knee, unspecified: Secondary | ICD-10-CM | POA: Diagnosis not present

## 2014-12-05 DIAGNOSIS — Z79899 Other long term (current) drug therapy: Secondary | ICD-10-CM | POA: Insufficient documentation

## 2014-12-05 DIAGNOSIS — Z87891 Personal history of nicotine dependence: Secondary | ICD-10-CM | POA: Insufficient documentation

## 2014-12-05 DIAGNOSIS — Z01818 Encounter for other preprocedural examination: Secondary | ICD-10-CM | POA: Diagnosis present

## 2014-12-05 HISTORY — DX: Dorsalgia, unspecified: M54.9

## 2014-12-05 HISTORY — DX: Gastro-esophageal reflux disease without esophagitis: K21.9

## 2014-12-05 HISTORY — DX: Pain in unspecified joint: M25.50

## 2014-12-05 HISTORY — DX: Anxiety disorder, unspecified: F41.9

## 2014-12-05 HISTORY — DX: Unspecified osteoarthritis, unspecified site: M19.90

## 2014-12-05 HISTORY — DX: Frequency of micturition: R35.0

## 2014-12-05 HISTORY — DX: Headache, unspecified: R51.9

## 2014-12-05 HISTORY — DX: Nocturia: R35.1

## 2014-12-05 HISTORY — DX: Disorientation, unspecified: R41.0

## 2014-12-05 HISTORY — DX: Cerebral aneurysm, nonruptured: I67.1

## 2014-12-05 HISTORY — DX: Unspecified cataract: H26.9

## 2014-12-05 HISTORY — DX: Hyperlipidemia, unspecified: E78.5

## 2014-12-05 HISTORY — DX: Insomnia, unspecified: G47.00

## 2014-12-05 HISTORY — DX: Effusion, unspecified joint: M25.40

## 2014-12-05 HISTORY — DX: Carpal tunnel syndrome, unspecified upper limb: G56.00

## 2014-12-05 HISTORY — DX: Weakness: R53.1

## 2014-12-05 HISTORY — DX: Paroxysmal atrial fibrillation: I48.0

## 2014-12-05 HISTORY — DX: Personal history of other diseases of the respiratory system: Z87.09

## 2014-12-05 HISTORY — DX: Headache: R51

## 2014-12-05 HISTORY — DX: Essential (primary) hypertension: I10

## 2014-12-05 HISTORY — DX: Reserved for inherently not codable concepts without codable children: IMO0001

## 2014-12-05 LAB — SURGICAL PCR SCREEN
MRSA, PCR: NEGATIVE
Staphylococcus aureus: NEGATIVE

## 2014-12-05 LAB — PROTIME-INR
INR: 1.07 (ref 0.00–1.49)
Prothrombin Time: 14.1 seconds (ref 11.6–15.2)

## 2014-12-05 LAB — BASIC METABOLIC PANEL
Anion gap: 8 (ref 5–15)
BUN: 10 mg/dL (ref 6–23)
CHLORIDE: 106 mmol/L (ref 96–112)
CO2: 28 mmol/L (ref 19–32)
Calcium: 9 mg/dL (ref 8.4–10.5)
Creatinine, Ser: 0.82 mg/dL (ref 0.50–1.10)
GFR calc Af Amer: 79 mL/min — ABNORMAL LOW (ref >90)
GFR, EST NON AFRICAN AMERICAN: 68 mL/min — AB (ref >90)
GLUCOSE: 103 mg/dL — AB (ref 70–99)
POTASSIUM: 3.9 mmol/L (ref 3.5–5.1)
SODIUM: 142 mmol/L (ref 135–145)

## 2014-12-05 LAB — CBC
HCT: 39.9 % (ref 36.0–46.0)
HEMOGLOBIN: 12.8 g/dL (ref 12.0–15.0)
MCH: 29 pg (ref 26.0–34.0)
MCHC: 32.1 g/dL (ref 30.0–36.0)
MCV: 90.5 fL (ref 78.0–100.0)
Platelets: 191 10*3/uL (ref 150–400)
RBC: 4.41 MIL/uL (ref 3.87–5.11)
RDW: 15.1 % (ref 11.5–15.5)
WBC: 5.1 10*3/uL (ref 4.0–10.5)

## 2014-12-05 LAB — APTT: aPTT: 33 seconds (ref 24–37)

## 2014-12-05 NOTE — Progress Notes (Addendum)
Echo and stress test reports in epic from 2007  Saw a cardiologist about 2-423months ago bc Medical MD sent her d/t shortness of breath(cardiologist said everything ok with heart)

## 2014-12-06 ENCOUNTER — Encounter (HOSPITAL_COMMUNITY): Payer: Self-pay

## 2014-12-06 NOTE — Progress Notes (Signed)
Anesthesia Chart Review:  Patient is a 74 year old female scheduled for right TKA on 12/17/14 by Dr. Maureen Ralphs. Blackman.  History includes former smoker, HTN, HLD, exertional dyspnea, GERD, anxiety, insomnia, left TKA '09, PAF 03/2014 (found on Holter monitor; no anticoagulation at that time due to history of intracranial hemorrhage in 2010 and due to frequent falls), hospitalization 04/2009 for left frontal hematoma due to left frontal AVM s/p left frontal craniotomy with hematoma evacuation and AVM resection 04/11/09 s/p right occipitio-parietal VP shunt 05/01/09. She also required tracheostomy and PEG tube during her 2010 admission. PCP is Dr. Renford Dillsonald Polite.   She was referred to cardiologist Dr. Jacinto HalimGanji last summer for evaluation of possible SSS.  No high degree block was found so there was not felt to be any indication for PPM.  PAF was noted on Holter, but anti-coagulation was contraindicated.  Patient had an echo that showed normal LVEF and wall motion. Patient was symptomatic of her PAF, so no additional testing was felt indicated.  He did not feel antiarrhythmic therapy would decrease there risk of cardioembolic phenomenon for afib.  He recommended continued PCP follow-up with PRN cardiology follow-up.  Meds include amlodipine, ASA 81 mg, Keppra, Pepcid, Celexa.  02/07/14 EKG: NSR, LAD, poor precordial r wave progression consider anterior infarct (old), occasional PVC, non-specific lateral T wave abnormality.  03/14/14 echo [facility]): LV cavity is minimally dilated. Mild concentric LVH, normal global wall motion. Normal LV systolic function, calculated EF 58%. Grade 1 (impaired) diastolic dysfunction. No LV wall motion abnormalities. LA mildly dilated. Mild AV calcification. Mild MR/TR.  Holter monitor from 02/07/14-03/08/14 [facility]): PAF rate controlled. No heart block. Occasional PVCs. Patient activated events revealed junctional escape rhythm.  According to 06/20/14 note by Dr. Jacinto HalimGanji, he ordered a  nocturnal oximetry to evaluate for a correctible cause of arrhythmias, but it was normal.   Last stress test was from 2007.   Preoperative labs noted.   Reviewed with anesthesiologist Dr. Krista BlueSinger. If no acute changes or new CV changes or dysrhythmias then would anticipate that she could proceed as planned.  Velna Ochsllison Ramiah Helfrich, PA-C [facility]iology Phone [phone] 12/06/2014 5:43 PM

## 2014-12-16 MED ORDER — CEFAZOLIN SODIUM-DEXTROSE 2-3 GM-% IV SOLR
2.0000 g | INTRAVENOUS | Status: AC
  Administered 2014-12-17: 2 g via INTRAVENOUS
  Filled 2014-12-16: qty 50

## 2014-12-17 ENCOUNTER — Inpatient Hospital Stay (HOSPITAL_COMMUNITY)
Admission: RE | Admit: 2014-12-17 | Discharge: 2014-12-24 | DRG: 470 | Disposition: A | Discharge status: Skilled Nursing Facility | Payer: Medicare HMO | Source: Ambulatory Visit | Attending: Orthopaedic Surgery | Admitting: Orthopaedic Surgery

## 2014-12-17 ENCOUNTER — Encounter (HOSPITAL_COMMUNITY)
Admission: RE | Disposition: A | Discharge status: Skilled Nursing Facility | Payer: Self-pay | Source: Ambulatory Visit | Attending: Orthopaedic Surgery

## 2014-12-17 ENCOUNTER — Inpatient Hospital Stay (HOSPITAL_COMMUNITY): Payer: Medicare HMO | Admitting: Anesthesiology

## 2014-12-17 ENCOUNTER — Inpatient Hospital Stay (HOSPITAL_COMMUNITY): Payer: Medicare HMO

## 2014-12-17 ENCOUNTER — Inpatient Hospital Stay (HOSPITAL_COMMUNITY): Payer: Medicare HMO | Admitting: Emergency Medicine

## 2014-12-17 ENCOUNTER — Encounter (HOSPITAL_COMMUNITY): Payer: Self-pay | Admitting: *Deleted

## 2014-12-17 DIAGNOSIS — R7611 Nonspecific reaction to tuberculin skin test without active tuberculosis: Secondary | ICD-10-CM | POA: Diagnosis present

## 2014-12-17 DIAGNOSIS — Z87891 Personal history of nicotine dependence: Secondary | ICD-10-CM | POA: Diagnosis not present

## 2014-12-17 DIAGNOSIS — I69351 Hemiplegia and hemiparesis following cerebral infarction affecting right dominant side: Secondary | ICD-10-CM

## 2014-12-17 DIAGNOSIS — I480 Paroxysmal atrial fibrillation: Secondary | ICD-10-CM | POA: Diagnosis present

## 2014-12-17 DIAGNOSIS — I10 Essential (primary) hypertension: Secondary | ICD-10-CM | POA: Diagnosis present

## 2014-12-17 DIAGNOSIS — M7989 Other specified soft tissue disorders: Secondary | ICD-10-CM | POA: Diagnosis not present

## 2014-12-17 DIAGNOSIS — K219 Gastro-esophageal reflux disease without esophagitis: Secondary | ICD-10-CM | POA: Diagnosis present

## 2014-12-17 DIAGNOSIS — M810 Age-related osteoporosis without current pathological fracture: Secondary | ICD-10-CM | POA: Diagnosis present

## 2014-12-17 DIAGNOSIS — F419 Anxiety disorder, unspecified: Secondary | ICD-10-CM | POA: Diagnosis present

## 2014-12-17 DIAGNOSIS — E785 Hyperlipidemia, unspecified: Secondary | ICD-10-CM | POA: Diagnosis present

## 2014-12-17 DIAGNOSIS — M25561 Pain in right knee: Secondary | ICD-10-CM | POA: Diagnosis present

## 2014-12-17 DIAGNOSIS — Z96651 Presence of right artificial knee joint: Secondary | ICD-10-CM | POA: Diagnosis not present

## 2014-12-17 DIAGNOSIS — M1711 Unilateral primary osteoarthritis, right knee: Principal | ICD-10-CM | POA: Diagnosis present

## 2014-12-17 DIAGNOSIS — W19XXXA Unspecified fall, initial encounter: Secondary | ICD-10-CM

## 2014-12-17 DIAGNOSIS — I82401 Acute embolism and thrombosis of unspecified deep veins of right lower extremity: Secondary | ICD-10-CM

## 2014-12-17 HISTORY — PX: TOTAL KNEE ARTHROPLASTY: SHX125

## 2014-12-17 SURGERY — ARTHROPLASTY, KNEE, TOTAL
Anesthesia: Spinal | Site: Knee | Laterality: Right

## 2014-12-17 MED ORDER — MEPERIDINE HCL 25 MG/ML IJ SOLN: 6.2500 mg | INTRAMUSCULAR | Status: DC | PRN

## 2014-12-17 MED ORDER — CEFAZOLIN SODIUM 1-5 GM-% IV SOLN
1.0000 g | Freq: Four times a day (QID) | INTRAVENOUS | Status: AC
  Administered 2014-12-17 – 2014-12-18 (×2): 1 g via INTRAVENOUS
  Filled 2014-12-17 (×2): qty 50

## 2014-12-17 MED ORDER — EPHEDRINE SULFATE 50 MG/ML IJ SOLN
INTRAMUSCULAR | Status: DC | PRN
  Administered 2014-12-17: 10 mg via INTRAVENOUS

## 2014-12-17 MED ORDER — ONDANSETRON HCL 4 MG/2ML IJ SOLN: 4.0000 mg | Freq: Four times a day (QID) | INTRAMUSCULAR | Status: DC | PRN

## 2014-12-17 MED ORDER — MIDAZOLAM HCL 5 MG/5ML IJ SOLN
INTRAMUSCULAR | Status: DC | PRN
  Administered 2014-12-17: 1 mg via INTRAVENOUS

## 2014-12-17 MED ORDER — ALUM & MAG HYDROXIDE-SIMETH 200-200-20 MG/5ML PO SUSP: 30.0000 mL | ORAL | Status: DC | PRN

## 2014-12-17 MED ORDER — HYDROMORPHONE HCL 1 MG/ML IJ SOLN
0.2500 mg | INTRAMUSCULAR | Status: DC | PRN
  Administered 2014-12-17 (×2): 0.5 mg via INTRAVENOUS

## 2014-12-17 MED ORDER — CITALOPRAM HYDROBROMIDE 20 MG PO TABS
20.0000 mg | ORAL_TABLET | Freq: Every day | ORAL | Status: DC
  Administered 2014-12-17 – 2014-12-24 (×8): 20 mg via ORAL
  Filled 2014-12-17 (×8): qty 1

## 2014-12-17 MED ORDER — METHOCARBAMOL 500 MG PO TABS
500.0000 mg | ORAL_TABLET | Freq: Four times a day (QID) | ORAL | Status: DC | PRN
  Administered 2014-12-17 – 2014-12-23 (×10): 500 mg via ORAL
  Filled 2014-12-17 (×11): qty 1

## 2014-12-17 MED ORDER — OXYCODONE HCL 5 MG PO TABS
5.0000 mg | ORAL_TABLET | ORAL | Status: DC | PRN
  Administered 2014-12-17 – 2014-12-22 (×20): 10 mg via ORAL
  Administered 2014-12-23: 5 mg via ORAL
  Administered 2014-12-24: 10 mg via ORAL
  Filled 2014-12-17 (×11): qty 2
  Filled 2014-12-17: qty 1
  Filled 2014-12-17 (×10): qty 2

## 2014-12-17 MED ORDER — PHENYLEPHRINE HCL 10 MG/ML IJ SOLN
10.0000 mg | INTRAVENOUS | Status: DC | PRN
  Administered 2014-12-17: 20 ug/min via INTRAVENOUS

## 2014-12-17 MED ORDER — PROPOFOL INFUSION 10 MG/ML OPTIME
INTRAVENOUS | Status: DC | PRN
  Administered 2014-12-17: 50 ug/kg/min via INTRAVENOUS

## 2014-12-17 MED ORDER — PROMETHAZINE HCL 25 MG/ML IJ SOLN: 6.2500 mg | INTRAMUSCULAR | Status: DC | PRN

## 2014-12-17 MED ORDER — AMLODIPINE BESYLATE 10 MG PO TABS
10.0000 mg | ORAL_TABLET | Freq: Every day | ORAL | Status: DC
  Administered 2014-12-19 – 2014-12-24 (×6): 10 mg via ORAL
  Filled 2014-12-17 (×7): qty 1

## 2014-12-17 MED ORDER — LACTATED RINGERS IV SOLN
INTRAVENOUS | Status: DC
  Administered 2014-12-17 (×2): via INTRAVENOUS

## 2014-12-17 MED ORDER — LIDOCAINE HCL (CARDIAC) 20 MG/ML IV SOLN
INTRAVENOUS | Status: AC
  Filled 2014-12-17: qty 5

## 2014-12-17 MED ORDER — PHENYLEPHRINE HCL 10 MG/ML IJ SOLN
INTRAMUSCULAR | Status: DC | PRN
  Administered 2014-12-17 (×2): 40 ug via INTRAVENOUS
  Administered 2014-12-17 (×2): 80 ug via INTRAVENOUS
  Administered 2014-12-17: 40 ug via INTRAVENOUS
  Administered 2014-12-17: 80 ug via INTRAVENOUS

## 2014-12-17 MED ORDER — FENTANYL CITRATE 0.05 MG/ML IJ SOLN
INTRAMUSCULAR | Status: AC
  Filled 2014-12-17: qty 5

## 2014-12-17 MED ORDER — 0.9 % SODIUM CHLORIDE (POUR BTL) OPTIME
TOPICAL | Status: DC | PRN
  Administered 2014-12-17: 1000 mL

## 2014-12-17 MED ORDER — KETOROLAC TROMETHAMINE 30 MG/ML IJ SOLN
30.0000 mg | Freq: Once | INTRAMUSCULAR | Status: AC | PRN
Start: 2014-12-17 — End: 2014-12-17
  Administered 2014-12-17: 30 mg via INTRAVENOUS

## 2014-12-17 MED ORDER — DIPHENHYDRAMINE HCL 12.5 MG/5ML PO ELIX: 12.5000 mg | ORAL_SOLUTION | ORAL | Status: DC | PRN

## 2014-12-17 MED ORDER — MENTHOL 3 MG MT LOZG: 1.0000 | LOZENGE | OROMUCOSAL | Status: DC | PRN

## 2014-12-17 MED ORDER — FENTANYL CITRATE 0.05 MG/ML IJ SOLN
INTRAMUSCULAR | Status: AC
  Filled 2014-12-17: qty 2

## 2014-12-17 MED ORDER — MIDAZOLAM HCL 2 MG/2ML IJ SOLN
INTRAMUSCULAR | Status: AC
  Filled 2014-12-17: qty 2

## 2014-12-17 MED ORDER — DOCUSATE SODIUM 100 MG PO CAPS
100.0000 mg | ORAL_CAPSULE | Freq: Two times a day (BID) | ORAL | Status: DC
  Administered 2014-12-17 – 2014-12-24 (×14): 100 mg via ORAL
  Filled 2014-12-17 (×14): qty 1

## 2014-12-17 MED ORDER — POLYETHYLENE GLYCOL 3350 17 G PO PACK: 17.0000 g | PACK | Freq: Every day | ORAL | Status: DC | PRN

## 2014-12-17 MED ORDER — PROPOFOL 10 MG/ML IV BOLUS
INTRAVENOUS | Status: AC
  Filled 2014-12-17: qty 20

## 2014-12-17 MED ORDER — PHENOL 1.4 % MT LIQD: 1.0000 | OROMUCOSAL | Status: DC | PRN

## 2014-12-17 MED ORDER — METOCLOPRAMIDE HCL 10 MG PO TABS: 5.0000 mg | ORAL_TABLET | Freq: Three times a day (TID) | ORAL | Status: DC | PRN

## 2014-12-17 MED ORDER — LEVETIRACETAM 500 MG PO TABS
1000.0000 mg | ORAL_TABLET | Freq: Two times a day (BID) | ORAL | Status: DC
  Administered 2014-12-17 – 2014-12-24 (×14): 1000 mg via ORAL
  Filled 2014-12-17 (×13): qty 2

## 2014-12-17 MED ORDER — KETOROLAC TROMETHAMINE 30 MG/ML IJ SOLN
INTRAMUSCULAR | Status: AC
  Filled 2014-12-17: qty 1

## 2014-12-17 MED ORDER — HYDROMORPHONE HCL 1 MG/ML IJ SOLN
0.5000 mg | INTRAMUSCULAR | Status: DC | PRN
  Administered 2014-12-17 – 2014-12-19 (×2): 0.5 mg via INTRAVENOUS
  Filled 2014-12-17 (×3): qty 1

## 2014-12-17 MED ORDER — ONDANSETRON HCL 4 MG PO TABS: 4.0000 mg | ORAL_TABLET | Freq: Four times a day (QID) | ORAL | Status: DC | PRN

## 2014-12-17 MED ORDER — RIVAROXABAN 10 MG PO TABS
10.0000 mg | ORAL_TABLET | Freq: Every day | ORAL | Status: DC
  Administered 2014-12-18 – 2014-12-24 (×7): 10 mg via ORAL
  Filled 2014-12-17 (×7): qty 1

## 2014-12-17 MED ORDER — METOCLOPRAMIDE HCL 5 MG/ML IJ SOLN: 5.0000 mg | Freq: Three times a day (TID) | INTRAMUSCULAR | Status: DC | PRN

## 2014-12-17 MED ORDER — SODIUM CHLORIDE 0.9 % IR SOLN
Status: DC | PRN
  Administered 2014-12-17: 3000 mL

## 2014-12-17 MED ORDER — HYDROMORPHONE HCL 1 MG/ML IJ SOLN
INTRAMUSCULAR | Status: AC
  Filled 2014-12-17: qty 1

## 2014-12-17 MED ORDER — METHOCARBAMOL 1000 MG/10ML IJ SOLN
500.0000 mg | Freq: Four times a day (QID) | INTRAVENOUS | Status: DC | PRN
  Filled 2014-12-17: qty 5

## 2014-12-17 MED ORDER — SODIUM CHLORIDE 0.9 % IV SOLN
INTRAVENOUS | Status: DC
  Administered 2014-12-21: 03:00:00 via INTRAVENOUS

## 2014-12-17 MED ORDER — ALBUMIN HUMAN 5 % IV SOLN
INTRAVENOUS | Status: DC | PRN
  Administered 2014-12-17: 15:00:00 via INTRAVENOUS

## 2014-12-17 MED ORDER — ONDANSETRON HCL 4 MG/2ML IJ SOLN
INTRAMUSCULAR | Status: DC | PRN
  Administered 2014-12-17: 4 mg via INTRAVENOUS

## 2014-12-17 MED ORDER — FAMOTIDINE 20 MG PO TABS
40.0000 mg | ORAL_TABLET | Freq: Two times a day (BID) | ORAL | Status: DC
  Administered 2014-12-17 – 2014-12-24 (×14): 40 mg via ORAL
  Filled 2014-12-17 (×15): qty 2

## 2014-12-17 MED ORDER — ACETAMINOPHEN 325 MG PO TABS: 650.0000 mg | ORAL_TABLET | Freq: Four times a day (QID) | ORAL | Status: DC | PRN

## 2014-12-17 MED ORDER — TRANEXAMIC ACID 100 MG/ML IV SOLN: 1000.0000 mg | INTRAVENOUS | Status: DC

## 2014-12-17 MED ORDER — FENTANYL CITRATE 0.05 MG/ML IJ SOLN
INTRAMUSCULAR | Status: DC | PRN
  Administered 2014-12-17: 50 ug via INTRAVENOUS

## 2014-12-17 MED ORDER — ACETAMINOPHEN 650 MG RE SUPP: 650.0000 mg | Freq: Four times a day (QID) | RECTAL | Status: DC | PRN

## 2014-12-17 SURGICAL SUPPLY — 66 items
BANDAGE ELASTIC 6 VELCRO ST LF (GAUZE/BANDAGES/DRESSINGS) ×4 IMPLANT
BANDAGE ESMARK 6X9 LF (GAUZE/BANDAGES/DRESSINGS) ×1 IMPLANT
BLADE SAG 18X100X1.27 (BLADE) ×5 IMPLANT
BNDG CMPR 9X6 STRL LF SNTH (GAUZE/BANDAGES/DRESSINGS) ×1
BNDG ESMARK 6X9 LF (GAUZE/BANDAGES/DRESSINGS) ×3
BOWL SMART MIX CTS (DISPOSABLE) IMPLANT
CAPT KNEE TOTAL 3 ×2 IMPLANT
CEMENT BONE SIMPLEX SPEEDSET (Cement) ×4 IMPLANT
CLOSURE STERI-STRIP 1/2X4 (GAUZE/BANDAGES/DRESSINGS) ×2
CLOSURE WOUND 1/2 X4 (GAUZE/BANDAGES/DRESSINGS) ×2
CLSR STERI-STRIP ANTIMIC 1/2X4 (GAUZE/BANDAGES/DRESSINGS) ×2 IMPLANT
COVER SURGICAL LIGHT HANDLE (MISCELLANEOUS) ×3 IMPLANT
CUFF TOURNIQUET SINGLE 34IN LL (TOURNIQUET CUFF) ×3 IMPLANT
CUFF TOURNIQUET SINGLE 44IN (TOURNIQUET CUFF) IMPLANT
DRAPE IMP U-DRAPE 54X76 (DRAPES) ×3 IMPLANT
DRAPE INCISE IOBAN 66X45 STRL (DRAPES) ×3 IMPLANT
DRAPE ORTHO SPLIT 77X108 STRL (DRAPES) ×6
DRAPE PROXIMA HALF (DRAPES) ×3 IMPLANT
DRAPE SURG ORHT 6 SPLT 77X108 (DRAPES) ×2 IMPLANT
DRAPE U-SHAPE 47X51 STRL (DRAPES) ×3 IMPLANT
DRSG PAD ABDOMINAL 8X10 ST (GAUZE/BANDAGES/DRESSINGS) ×3 IMPLANT
DURAPREP 26ML APPLICATOR (WOUND CARE) ×3 IMPLANT
ELECT REM PT RETURN 9FT ADLT (ELECTROSURGICAL) ×3
ELECTRODE REM PT RTRN 9FT ADLT (ELECTROSURGICAL) ×1 IMPLANT
EVACUATOR 1/8 PVC DRAIN (DRAIN) IMPLANT
FACESHIELD WRAPAROUND (MASK) ×6 IMPLANT
FACESHIELD WRAPAROUND OR TEAM (MASK) ×3 IMPLANT
GAUZE SPONGE 4X4 12PLY STRL (GAUZE/BANDAGES/DRESSINGS) ×3 IMPLANT
GAUZE XEROFORM 1X8 LF (GAUZE/BANDAGES/DRESSINGS) ×3 IMPLANT
GLOVE BIO SURGEON STRL SZ8 (GLOVE) ×6 IMPLANT
GLOVE BIOGEL PI IND STRL 8 (GLOVE) ×1 IMPLANT
GLOVE BIOGEL PI INDICATOR 8 (GLOVE) ×4
GLOVE ECLIPSE 7.0 STRL STRAW (GLOVE) ×1 IMPLANT
GLOVE ORTHO TXT STRL SZ7.5 (GLOVE) ×3 IMPLANT
GOWN STRL REUS W/ TWL LRG LVL3 (GOWN DISPOSABLE) ×2 IMPLANT
GOWN STRL REUS W/ TWL XL LVL3 (GOWN DISPOSABLE) ×4 IMPLANT
GOWN STRL REUS W/TWL LRG LVL3 (GOWN DISPOSABLE) ×6
GOWN STRL REUS W/TWL XL LVL3 (GOWN DISPOSABLE) ×12
HANDPIECE INTERPULSE COAX TIP (DISPOSABLE)
IMMOBILIZER KNEE 22 UNIV (SOFTGOODS) IMPLANT
KIT BASIN OR (CUSTOM PROCEDURE TRAY) ×3 IMPLANT
KIT ROOM TURNOVER OR (KITS) ×3 IMPLANT
MANIFOLD NEPTUNE II (INSTRUMENTS) ×3 IMPLANT
NS IRRIG 1000ML POUR BTL (IV SOLUTION) ×3 IMPLANT
PACK TOTAL JOINT (CUSTOM PROCEDURE TRAY) ×3 IMPLANT
PACK UNIVERSAL I (CUSTOM PROCEDURE TRAY) ×3 IMPLANT
PAD ARMBOARD 7.5X6 YLW CONV (MISCELLANEOUS) ×6 IMPLANT
PADDING CAST COTTON 6X4 STRL (CAST SUPPLIES) ×3 IMPLANT
SET HNDPC FAN SPRY TIP SCT (DISPOSABLE) IMPLANT
SET PAD KNEE POSITIONER (MISCELLANEOUS) ×3 IMPLANT
SPONGE GAUZE 4X4 12PLY STER LF (GAUZE/BANDAGES/DRESSINGS) ×2 IMPLANT
STAPLER VISISTAT 35W (STAPLE) IMPLANT
STRIP CLOSURE SKIN 1/2X4 (GAUZE/BANDAGES/DRESSINGS) ×4 IMPLANT
SUCTION FRAZIER TIP 10 FR DISP (SUCTIONS) IMPLANT
SUT VIC AB 0 CT1 27 (SUTURE) ×3
SUT VIC AB 0 CT1 27XBRD ANBCTR (SUTURE) ×1 IMPLANT
SUT VIC AB 1 CT1 27 (SUTURE) ×6
SUT VIC AB 1 CT1 27XBRD ANBCTR (SUTURE) ×2 IMPLANT
SUT VIC AB 2-0 CT1 27 (SUTURE) ×6
SUT VIC AB 2-0 CT1 TAPERPNT 27 (SUTURE) ×2 IMPLANT
SUT VIC AB 4-0 PS2 27 (SUTURE) ×3 IMPLANT
TOWEL OR 17X24 6PK STRL BLUE (TOWEL DISPOSABLE) ×3 IMPLANT
TOWEL OR 17X26 10 PK STRL BLUE (TOWEL DISPOSABLE) ×3 IMPLANT
TRAY FOLEY CATH 16FRSI W/METER (SET/KITS/TRAYS/PACK) ×2 IMPLANT
WATER STERILE IRR 1000ML POUR (IV SOLUTION) ×6 IMPLANT
WRAP KNEE MAXI GEL POST OP (GAUZE/BANDAGES/DRESSINGS) ×3 IMPLANT

## 2014-12-17 NOTE — Anesthesia Postprocedure Evaluation (Signed)
  Anesthesia Post-op Note  Patient: Erskine SpeedBernice C Macphee  Procedure(s) Performed: Procedure(s): RIGHT TOTAL KNEE ARTHROPLASTY (Right)  Patient Location: PACU  Anesthesia Type:Spinal  Level of Consciousness: awake  Airway and Oxygen Therapy: Patient Spontanous Breathing  Post-op Pain: mild  Post-op Assessment: Post-op Vital signs reviewed  Post-op Vital Signs: Reviewed  Last Vitals:  Filed Vitals:   12/17/14 1715  BP: 111/75  Pulse: 47  Temp:   Resp: 16    Complications: No apparent anesthesia complications

## 2014-12-17 NOTE — Progress Notes (Signed)
Utilization review completed.  

## 2014-12-17 NOTE — H&P (Signed)
TOTAL KNEE ADMISSION H&P  Patient is being admitted for right total knee arthroplasty.  Subjective:  Chief Complaint:right knee pain.  HPI: Gina Parker, 74 y.o. female, has a history of pain and functional disability in the right knee due to arthritis and has failed non-surgical conservative treatments for greater than 12 weeks to includeNSAID's and/or analgesics, corticosteriod injections, flexibility and strengthening excercises, use of assistive devices and activity modification.  Onset of symptoms was gradual, starting 1 years ago with gradually worsening course since that time. The patient noted no past surgery on the right knee(s).  Patient currently rates pain in the right knee(s) at 8 out of 10 with activity. Patient has night pain, worsening of pain with activity and weight bearing, pain that interferes with activities of daily living, pain with passive range of motion, crepitus and joint swelling.  Patient has evidence of subchondral sclerosis, periarticular osteophytes and joint space narrowing by imaging studies. There is no active infection.  Patient Active Problem List   Diagnosis Date Noted  . Osteoarthritis of right knee 12/17/2014  . POSITIVE TB SKIN TEST, WITHOUT TUBERCULOSIS 05/23/2009  . HYPERLIPIDEMIA 05/12/2009  . ANEMIA 05/12/2009  . HYPERTENSION 05/12/2009  . INTRACEREBRAL HEMORRHAGE 05/12/2009  . CVA WITH RIGHT HEMIPARESIS 05/12/2009  . DEGENERATIVE JOINT DISEASE 05/12/2009  . OSTEOPOROSIS 05/12/2009   Past Medical History  Diagnosis Date  . Hypertension     takes Amlodipine daily  . Hyperlipidemia     doesn't take any meds  . Shortness of breath dyspnea     with exertion  . History of bronchitis     many,many yrs ago  . Headache     couple of times a week  . Brain aneurysm     takes Keppra daily  . Confusion   . Weakness     tingling in both hand  . Carpal tunnel syndrome   . Arthritis   . Joint pain   . Joint swelling   . Back pain    arthritis  . GERD (gastroesophageal reflux disease)     takes pepcid daily  . Urinary frequency   . Nocturia   . Cataracts, bilateral     immautre  . Anxiety     takes Citalpram daily  . Insomnia   . PAF (paroxysmal atrial fibrillation)     03/2014 (Dr. Yates Decamp); not felt to be a candidate for anti-coag at that time due to h/o intracranial hemorrhage '10    Past Surgical History  Procedure Laterality Date  . Brain surgery  2007    aneurysm  . Joint replacement Left   . Ventriculoperitoneal shunt      05/01/09  . Tracheostomy      04/2009    Prescriptions prior to admission  Medication Sig Dispense Refill Last Dose  . amLODipine (NORVASC) 10 MG tablet Take 10 mg by mouth daily.     Marland Kitchen aspirin EC 81 MG tablet Take 81 mg by mouth daily.     . citalopram (CELEXA) 20 MG tablet Take 20 mg by mouth daily.     . famotidine (PEPCID) 40 MG tablet Take 40 mg by mouth 4 (four) times daily.     Marland Kitchen levETIRAcetam (KEPPRA) 1000 MG tablet Take 1,000 mg by mouth 2 (two) times daily.      No Known Allergies  History  Substance Use Topics  . Smoking status: Former Games developer  . Smokeless tobacco: Not on file     Comment: quit smoking >82yrs ago  .  Alcohol Use: No    No family history on file.   Review of Systems  Musculoskeletal: Positive for joint pain.  All other systems reviewed and are negative.   Objective:  Physical Exam  Constitutional: She is oriented to person, place, and time. She appears well-developed and well-nourished.  HENT:  Head: Normocephalic and atraumatic.  Eyes: EOM are normal. Pupils are equal, round, and reactive to light.  Neck: Normal range of motion. Neck supple.  Cardiovascular: Regular rhythm.   Respiratory: Effort normal and breath sounds normal.  GI: Soft. Bowel sounds are normal.  Musculoskeletal:       Right knee: She exhibits decreased range of motion, swelling and abnormal alignment. Tenderness found. Medial joint line and lateral joint line tenderness  noted.  Neurological: She is alert and oriented to person, place, and time.  Skin: Skin is warm and dry.  Psychiatric: She has a normal mood and affect.    Vital signs in last 24 hours: BP: (115)/(85) 115/85 mmHg (03/15 1139) SpO2:  [100 %] 100 % (03/15 1139) Weight:  [91.627 kg (202 lb)] 91.627 kg (202 lb) (03/15 1139)  Labs:   Estimated body mass index is 33.61 kg/(m^2) as calculated from the following:   Height as of this encounter: 5\' 5"  (1.651 m).   Weight as of this encounter: 91.627 kg (202 lb).   Imaging Review Plain radiographs demonstrate severe degenerative joint disease of the right knee(s). The overall alignment ismild varus. The bone quality appears to be good for age and reported activity level.  Assessment/Plan:  End stage arthritis, right knee   The patient history, physical examination, clinical judgment of the provider and imaging studies are consistent with end stage degenerative joint disease of the right knee(s) and total knee arthroplasty is deemed medically necessary. The treatment options including medical management, injection therapy arthroscopy and arthroplasty were discussed at length. The risks and benefits of total knee arthroplasty were presented and reviewed. The risks due to aseptic loosening, infection, stiffness, patella tracking problems, thromboembolic complications and other imponderables were discussed. The patient acknowledged the explanation, agreed to proceed with the plan and consent was signed. Patient is being admitted for inpatient treatment for surgery, pain control, PT, OT, prophylactic antibiotics, VTE prophylaxis, progressive ambulation and ADL's and discharge planning. The patient is planning to be discharged to skilled nursing facility

## 2014-12-17 NOTE — Anesthesia Preprocedure Evaluation (Addendum)
Anesthesia Evaluation  Patient identified by MRN, date of birth, ID band Patient awake    Reviewed: Allergy & Precautions, NPO status , Patient's Chart, lab work & pertinent test results  Airway Mallampati: II  TM Distance: >3 FB Neck ROM: Full    Dental  (+) Partial Upper, Dental Advisory Given   Pulmonary shortness of breath and with exertion, former smoker,  breath sounds clear to auscultation        Cardiovascular hypertension, Pt. on medications + Peripheral Vascular Disease Rhythm:Irregular Rate:Abnormal     Neuro/Psych  Headaches, Seizures -, Well Controlled,     GI/Hepatic Neg liver ROS, GERD-  Medicated and Controlled,  Endo/Other  Morbid obesity  Renal/GU negative Renal ROS     Musculoskeletal  (+) Arthritis -,   Abdominal   Peds  Hematology  (+) anemia ,   Anesthesia Other Findings   Reproductive/Obstetrics negative OB ROS                           Anesthesia Physical Anesthesia Plan  ASA: II  Anesthesia Plan: Spinal   Post-op Pain Management:    Induction:   Airway Management Planned:   Additional Equipment:   Intra-op Plan:   Post-operative Plan:   Informed Consent: I have reviewed the patients History and Physical, chart, labs and discussed the procedure including the risks, benefits and alternatives for the proposed anesthesia with the patient or authorized representative who has indicated his/her understanding and acceptance.   Dental advisory given  Plan Discussed with: CRNA and Anesthesiologist  Anesthesia Plan Comments:       Anesthesia Quick Evaluation

## 2014-12-17 NOTE — Brief Op Note (Signed)
12/17/2014  3:21 PM  PATIENT:  Gina Parker  74 y.o. female  PRE-OPERATIVE DIAGNOSIS:  osteoarthritis right knee  POST-OPERATIVE DIAGNOSIS:  osteoarthritis right knee  PROCEDURE:  Procedure(s): RIGHT TOTAL KNEE ARTHROPLASTY (Right)  SURGEON:  Surgeon(s) and Role:    * Kathryne Hitchhristopher Y Sena Clouatre, MD - Primary  PHYSICIAN ASSISTANT: Rexene EdisonGil Clark, PA-C  ANESTHESIA:   spinal  EBL:  Total I/O In: 1650 [I.V.:1400; IV Piggyback:250] Out: 50 [Blood:50]  BLOOD ADMINISTERED:none  DRAINS: none   LOCAL MEDICATIONS USED:  NONE  SPECIMEN:  No Specimen  DISPOSITION OF SPECIMEN:  N/A  COUNTS:  YES  TOURNIQUET:   Total Tourniquet Time Documented: Thigh (Right) - 59 minutes Total: Thigh (Right) - 59 minutes   DICTATION: .Other Dictation: Dictation Number [id number]  PLAN OF CARE: Admit to inpatient   PATIENT DISPOSITION:  PACU - hemodynamically stable.   Delay start of Pharmacological VTE agent (>24hrs) due to surgical blood loss or risk of bleeding: no

## 2014-12-17 NOTE — Transfer of Care (Signed)
Immediate Anesthesia Transfer of Care Note  Patient: Gina SpeedBernice C Hamberger  Procedure(s) Performed: Procedure(s): RIGHT TOTAL KNEE ARTHROPLASTY (Right)  Patient Location: PACU  Anesthesia Type:MAC and Spinal  Level of Consciousness: awake, oriented and patient cooperative  Airway & Oxygen Therapy: Patient Spontanous Breathing and Patient connected to nasal cannula oxygen  Post-op Assessment: Report given to RN and Post -op Vital signs reviewed and stable  Post vital signs: Reviewed  Last Vitals:  Filed Vitals:   12/17/14 1139  BP: 115/85    Complications: No apparent anesthesia complications

## 2014-12-17 NOTE — Progress Notes (Signed)
Orthopedic Tech Progress Note Patient Details:  Erskine SpeedBernice C Seitzinger April 09, 1941 [id number]  CPM Right Knee CPM Right Knee: On Right Knee Flexion (Degrees): 60 Right Knee Extension (Degrees): 0   [name], Cintya Daughety Craig 12/17/2014, 5:37 PM

## 2014-12-17 NOTE — Anesthesia Procedure Notes (Addendum)
Spinal Patient location during procedure: OR Staffing Anesthesiologist: Nolon Nations Performed by: anesthesiologist  Preanesthetic Checklist Completed: patient identified, site marked, surgical consent, pre-op evaluation, timeout performed, IV checked, risks and benefits discussed and monitors and equipment checked Spinal Block Patient position: sitting Prep: ChloraPrep Patient monitoring: heart rate, continuous pulse ox and blood pressure Approach: right paramedian Location: L3-4 Injection technique: single-shot Needle Needle type: Sprotte  Needle gauge: 24 G Needle length: 9 cm Additional Notes Expiration date of kit checked and confirmed. Patient tolerated procedure well, without complications.  Procedure Name: MAC Date/Time: 12/17/2014 1:45 PM Performed by: Jenne Campus Pre-anesthesia Checklist: Patient identified, Emergency Drugs available, Suction available, Patient being monitored and Timeout performed Patient Re-evaluated:Patient Re-evaluated prior to inductionOxygen Delivery Method: Simple face mask

## 2014-12-18 ENCOUNTER — Encounter (HOSPITAL_COMMUNITY): Payer: Self-pay | Admitting: Orthopaedic Surgery

## 2014-12-18 LAB — CBC
HCT: 35.6 % — ABNORMAL LOW (ref 36.0–46.0)
HEMOGLOBIN: 11.3 g/dL — AB (ref 12.0–15.0)
MCH: 28.7 pg (ref 26.0–34.0)
MCHC: 31.7 g/dL (ref 30.0–36.0)
MCV: 90.4 fL (ref 78.0–100.0)
Platelets: 174 10*3/uL (ref 150–400)
RBC: 3.94 MIL/uL (ref 3.87–5.11)
RDW: 14.8 % (ref 11.5–15.5)
WBC: 5.3 10*3/uL (ref 4.0–10.5)

## 2014-12-18 LAB — BASIC METABOLIC PANEL
ANION GAP: 10 (ref 5–15)
BUN: 8 mg/dL (ref 6–23)
CALCIUM: 8.7 mg/dL (ref 8.4–10.5)
CO2: 24 mmol/L (ref 19–32)
CREATININE: 0.77 mg/dL (ref 0.50–1.10)
Chloride: 105 mmol/L (ref 96–112)
GFR calc Af Amer: >90 mL/min (ref >90)
GFR, EST NON AFRICAN AMERICAN: 80 mL/min — AB (ref >90)
Glucose, Bld: 110 mg/dL — ABNORMAL HIGH (ref 70–99)
Potassium: 3.7 mmol/L (ref 3.5–5.1)
Sodium: 139 mmol/L (ref 135–145)

## 2014-12-18 NOTE — Op Note (Signed)
NAMWindy Parker:  Blatz, Oral              ACCOUNT NO.:  [id number]  MEDICAL RECORD NO.:  [id number]  LOCATION:  [id number]                        FACILITY:  [facility]  PHYSICIAN:  Gina Parker, M.D.DATE OF BIRTH:  [birth year]  DATE OF PROCEDURE:  12/17/2014 DATE OF DISCHARGE:                              OPERATIVE REPORT   PREOPERATIVE DIAGNOSIS:  Primary osteoarthritis and degenerative joint disease, right knee.  POSTOPERATIVE DIAGNOSIS:  Primary osteoarthritis and degenerative joint disease, right knee.  PROCEDURE:  Right total knee arthroplasty.  IMPLANTS:  Stryker Triathlon knee with size 3 femur, size 3 tibial tray, 9-mm fix bearing polyethylene insert, size 29 patellar button.  SURGEON:  Gina PandaChristopher Y. Magnus IvanBlackman, MD  ASSISTANT:  Gina CanalGilbert Clark, PA-C  ANESTHESIA:  Spinal.  TOURNIQUET TIME:  59 minutes.  ANTIBIOTICS:  2 g IV Ancef.  BLOOD LOSS:  Less than 100 mL.  COMPLICATIONS:  None.  INDICATIONS:  [name] is a very pleasant 74 year old female with debilitating arthritis and pain involving her right knee.  She has had some instability symptoms of that knee with x-ray showing complete loss of medial joint space, a varus deformity with periarticular osteophytes and tricompartmental arthritis throughout the knee.  There were also sclerotic changes.  She has had injections in her knee as well as aspirations and this has not helped.  At this point given her daily pain, her decreased mobility, and her decreased quality of life, she does wish to proceed with a right total knee arthroplasty.  She has had a previous left total knee arthroplasty about 7 years ago or so and this was very successful.  She does understand the risk of acute blood loss anemia, nerve and vessel injury, fracture, infection, and the DVT.  She understands the goals are decreased pain, improved mobility, and overall improved quality of life.  PROCEDURE DESCRIPTION:  After informed consent was  obtained, appropriate right knee was marked, she was brought to the operating room and spinal anesthesia was obtained.  She was then laid in supine position.  A Foley catheter was placed and a nonsterile tourniquet placed around her upper right thigh.  Her right leg was prepped and draped from the thigh down the toes with DuraPrep and sterile drapes including sterile stockinette. A time-out was called and she was identified as the correct patient, correct right knee.  We then used an Esmarch to wrap out the leg and tourniquet was inflated to 300 mm of pressure.  I then made a midline incision over the patella and carried this proximally and distally.  We dissected down the knee joint and carried out a medial parapatellar arthrotomy.  We then drained the effusion from the knee and cleaned the knee of osteophytes as well as remnants of the medial and lateral meniscus and ACL and PCL.  With the knee in a flexed position using the intramedullary cutting guide for the tibia correcting for varus and valgus and negative slope, we were able to take 9 mm off the high side making our tibial cut.  I felt this was not much of a cut so then we cut an additional 2 mm.  We then went to the femur with the knee in a  flexed position using intramedullary cutting guide, drilled to the intercondylar area, we placed a 5 degree right cutting block set for a 10-mm distal femoral cut.  We then made this cut without difficulty.  I brought the knee back down in extended position with a 9-mm extension block.  Her extension was full.  I was pleased with the alignment.  We then went back to the femur and set up our femoral sizing guide based off the epicondylar axis and chose a size 3 femur.  We then put our 4-in- 1 cutting block for a size 3 femur, made our anterior and posterior cuts followed by our chamfer cuts.  We then made our femoral box cut.  I then went back to the tibia and set our tibial rotation off the  tibial tubercle and the femoral component.  We chose a size 3 tibial for this and made our keel punch cut for this.  With a trial 3 tibia and a trial 3 femur, we trialed a 9-mm polyethylene insert.  I was pleased with the range of motion and stability.  We then made our patellar cut and drilled holes for a patella button.  With all trial components in place, I was again pleased with stability and range of motion.  We then removed all trial components and then copiously irrigated the knee with normal saline solution using pulsatile lavage.  We then mixed our cement and cemented the real Stryker Triathlon tibial tray size 3, followed by the real size 3 femur.  We cleaned cement debris from the knee and then placed the real 9-mm fix bearing polyethylene insert.  We then cemented the patellar button as well.  Once the cement had dried and hardened, we let the tourniquet down and hemostasis was obtained with electrocautery. We then irrigated the knee again with normal saline solution and closed the joint capsule with interrupted #1 Vicryl suture, followed by 0 Vicryl in the deep tissue, 2-0 Vicryl in subcutaneous tissue, 4-0 Monocryl subcuticular stitch and Steri-Strips on the skin.  Well-padded sterile dressing was applied.  She was taken to recovery room in stable condition.  All final counts were correct. There were no complications noted.  Of note, Gina Edison, PA-C assisted in the entire case and his assistance was crucial in facilitating all aspects of this case.     Gina Panda. Magnus Ivan, M.D.     CYB/MEDQ  D:  12/17/2014  T:  12/18/2014  Job:  [id number]

## 2014-12-18 NOTE — Evaluation (Signed)
Physical Therapy Evaluation Patient Details Name: Gina Parker MRN: [id number] DOB: 09/08/1941 Today's Date: 12/18/2014   History of Present Illness  Pt s/p R TKA 3/15. 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 Pt currently requires mod/maXA for transfers and safe ambulation with RW. Pt quickly fatigues as well with DOE. Spoke with niece who plans on taking pt home. Informed niece that if pt can not achieve min guard assist level of function that we need to look at ST-SNF to allow pt to achieve maximal functional recovery for safe transition home with niece. Acute PT to con't to follow to progress mobility as able.    Follow Up Recommendations Home health PT;Supervision/Assistance - 24 hour (if pt can't achieve min guard A pt will need SNF)    Equipment Recommendations  None recommended by PT    Recommendations for Other Services       Precautions / Restrictions Precautions Precautions: Knee;Fall Precaution Booklet Issued: Yes (comment) Precaution Comments: pt with verbal understanding but unsure of carryover Required Braces or Orthoses: Knee Immobilizer - Right Knee Immobilizer - Right: On when out of bed or walking Restrictions Weight Bearing Restrictions: Yes RLE Weight Bearing: Weight bearing as tolerated      Mobility  Bed Mobility Overal bed mobility: Needs Assistance;+ 2 for safety/equipment Bed Mobility: Supine to Sit     Supine to sit: Max assist     General bed mobility comments: max directional v/c's for long sit technique, assist to brings hips to EOB  Transfers Overall transfer level: Needs assistance Equipment used: Rolling walker (2 wheeled) Transfers: Sit to/from Stand Sit to Stand: Mod assist         General transfer comment: max directional v/c's for safe hand placement, modA to maintain balance during transition of hands,  max directional v/c's for R LE management and safe technique. significant increase in time  Ambulation/Gait Ambulation/Gait assistance: Max assist;+2 safety/equipment Ambulation Distance (Feet): 3 Feet Assistive device: Rolling walker (2 wheeled) Gait Pattern/deviations: Step-to pattern;Antalgic;Decreased step length - right;Decreased stance time - right;Decreased weight shift to right Gait velocity: slow Gait velocity interpretation: <1.8 ft/sec, indicative of risk for recurrent falls General Gait Details: modA to maintain balance and manage walker, max directional v/c's for sequencing. + SOB however SpO2 at >97%. Pt impulsively sat in chair due to fatigue most likely  Stairs            Wheelchair Mobility    Modified Rankin (Stroke Patients Only)       Balance Overall balance assessment: Needs assistance Sitting-balance support: Feet supported Sitting balance-Leahy Scale: Fair     Standing balance support: Bilateral upper extremity supported Standing balance-Leahy Scale: Poor Standing balance comment: pt requires minA and use of RW for safe standing                             Pertinent Vitals/Pain Pain Assessment: 0-10 Pain Score: 8  Pain Location: R knee Pain Intervention(s): Monitored during session    Home Living Family/patient expects to be discharged to:: Private residence Living Arrangements:  (neice) Available Help at Discharge: Family;Available 24 hours/day Type of Home: House Home Access: Stairs to enter Entrance Stairs-Rails: Can reach both Entrance Stairs-Number of Steps: 4 Home Layout: One level Home Equipment: Walker - 2 wheels;Bedside commode Additional Comments: dtr reports she is going to school and  that she will be taking time off to attend to her aunt    Prior Function Level of Independence: Needs assistance   Gait / Transfers Assistance Needed: amb with RW  ADL's / Homemaking Assistance Needed: minA with  dressing/bathing  Comments: PLOF validated by neice due to pt's cognitive impairements     Hand Dominance        Extremity/Trunk Assessment   Upper Extremity Assessment: Generalized weakness           Lower Extremity Assessment: RLE deficits/detail RLE Deficits / Details: difficulty initiating quad set, able to initiate hip/knee flexion, ankel WFL    Cervical / Trunk Assessment: Normal  Communication   Communication:  (delayed processing)  Cognition Arousal/Alertness: Awake/alert Behavior During Therapy: Flat affect Overall Cognitive Status: Impaired/Different from baseline Area of Impairment: Attention;Memory;Following commands;Awareness;Problem solving (h/o of VP shunt d/t aneurysm in 2010)   Current Attention Level: Sustained Memory: Decreased short-term memory Following Commands: Follows one step commands with increased time   Awareness: Emergent Problem Solving: Slow processing;Difficulty sequencing;Requires verbal cues;Requires tactile cues General Comments: pt with delayed processing at baseline due to aneurysm at baseline    General Comments      Exercises Total Joint Exercises Ankle Circles/Pumps: AROM;Both;10 reps;Supine Quad Sets: AROM;Right;10 reps;Supine Heel Slides: Right;10 reps;AAROM;Supine      Assessment/Plan    PT Assessment Patient needs continued PT services  PT Diagnosis Difficulty walking;Abnormality of gait;Generalized weakness;Acute pain   PT Problem List Decreased strength;Decreased range of motion;Decreased activity tolerance;Decreased balance;Decreased mobility  PT Treatment Interventions DME instruction;Gait training;Stair training;Functional mobility training;Therapeutic activities;Therapeutic exercise;Balance training   PT Goals (Current goals can be found in the Care Plan section) Acute Rehab PT Goals Patient Stated Goal: didn't state PT Goal Formulation: With patient Time For Goal Achievement: 12/25/14 Potential to Achieve  Goals: Good    Frequency 7X/week   Barriers to discharge        Co-evaluation               End of Session Equipment Utilized During Treatment: Gait belt Activity Tolerance: Patient limited by fatigue Patient left: in chair;with call bell/phone within reach Nurse Communication: Mobility status (pt with baseline delayed processing due to h/o aneurysm)         Time: [id number] PT Time Calculation (min) (ACUTE ONLY): 35 min   Charges:   PT Evaluation $Initial PT Evaluation Tier I: 1 Procedure PT Treatments $Therapeutic Activity: 8-22 mins   PT G CodesMarcene Brawn:        Shondell Poulson Marie 12/18/2014, 9:25 AM  Lewis ShockAshly Alishia Lebo, PT, DPT Pager #: [phone] Office #: [phone]

## 2014-12-18 NOTE — Progress Notes (Signed)
Spoke with patient regarding Xarelto to be given this AM. Patient states that she is able to take anticoagulant at this time.

## 2014-12-18 NOTE — Evaluation (Signed)
Occupational Therapy Evaluation Patient Details Name: Gina Parker MRN: [id number] DOB: 06-Dec-1940 Today's Date: 12/18/2014    History of Present Illness Pt s/p R TKA 3/15. 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    Clinical Impression   PTA pt lived at home and ambulated with RW. She required min A for ADLs. Pt currently limited by R Knee pain and decreased ROM which impairs her independence with ADLs. Pt will benefit from acute OT to address functional mobility and LB ADLs to promote independence and facilitate return home.     Follow Up Recommendations  SNF;Supervision/Assistance - 24 hour    Equipment Recommendations  None recommended by OT    Recommendations for Other Services       Precautions / Restrictions Precautions Precautions: Knee;Fall Precaution Booklet Issued: Yes (comment) Precaution Comments: pt with verbal understanding but unsure of carryover Required Braces or Orthoses: Knee Immobilizer - Right Knee Immobilizer - Right: On when out of bed or walking Restrictions Weight Bearing Restrictions: Yes RLE Weight Bearing: Weight bearing as tolerated      Mobility Bed Mobility Overal bed mobility: Needs Assistance;+ 2 for safety/equipment Bed Mobility: Supine to Sit     Supine to sit: Mod assist Sit to supine: Max assist   General bed mobility comments: Mod A to bring LEs to EOB and scoot hips out. Pt able to sit up without assistance. VC's for sequencing.   Transfers Overall transfer level: Needs assistance Equipment used: Rolling walker (2 wheeled) Transfers: Sit to/from UGI CorporationStand;Stand Pivot Transfers Sit to Stand: Mod assist Stand pivot transfers: Min assist       General transfer comment: Mod A to rise from bed. Min A to pivot to chair. Assist for controlled descent.          ADL Overall ADL's : Needs assistance/impaired Eating/Feeding: Independent;Sitting   Grooming: Set up;Sitting   Upper Body Bathing: Set  up;Sitting   Lower Body Bathing: Sit to/from stand;Moderate assistance   Upper Body Dressing : Set up;Sitting   Lower Body Dressing: Maximal assistance;Sit to/from stand   Toilet Transfer: Moderate assistance;Stand-pivot;RW Toilet Transfer Details (indicate cue type and reason): bed>recliner Toileting- Clothing Manipulation and Hygiene: Maximal assistance;Sit to/from stand               Vision Vision Assessment?: No apparent visual deficits          Pertinent Vitals/Pain Pain Assessment: 0-10 Pain Score: 6  Pain Location: R knee Pain Intervention(s): Limited activity within patient's tolerance;Monitored during session;Repositioned     Hand Dominance     Extremity/Trunk Assessment Upper Extremity Assessment Upper Extremity Assessment: Generalized weakness   Lower Extremity Assessment Lower Extremity Assessment: Defer to PT evaluation   Cervical / Trunk Assessment Cervical / Trunk Assessment: Normal   Communication Communication Communication: No difficulties   Cognition Arousal/Alertness: Awake/alert Behavior During Therapy: WFL for tasks assessed/performed Overall Cognitive Status: History of cognitive impairments - at baseline                 General Comments: pt with baseline delayed processing              Home Living Family/patient expects to be discharged to:: Private residence Living Arrangements: Other relatives Available Help at Discharge: Family;Available 24 hours/day Type of Home: House Home Access: Stairs to enter Enter[employer] CorporationEntrance Stairs-Number of Steps: 4 Entrance Stairs-Rails: Can reach both Home Layout: One level               Home Equipment:  Walker - 2 wheels;Bedside commode   Additional Comments: niece reports she is going to school and that she will be taking time off to attend to her aunt      Prior Functioning/Environment Level of Independence: Needs assistance  Gait / Transfers Assistance Needed: amb with RW ADL's /  Homemaking Assistance Needed: minA with dressing/bathing Communication / Swallowing Assistance Needed: delayed processing from craniotomy in 2010 Comments: PLOF validated by neice due to pt's cognitive impairements    OT Diagnosis: Generalized weakness;Acute pain   OT Problem List: Decreased strength;Decreased range of motion;Decreased activity tolerance;Impaired balance (sitting and/or standing);Decreased safety awareness;Decreased knowledge of use of DME or AE;Decreased knowledge of precautions;Pain   OT Treatment/Interventions: Self-care/ADL training;Therapeutic exercise;Energy conservation;DME and/or AE instruction;Therapeutic activities;Patient/family education;Balance training    OT Goals(Current goals can be found in the care plan section) Acute Rehab OT Goals Patient Stated Goal: to walk better OT Goal Formulation: With patient Time For Goal Achievement: 01/01/15 Potential to Achieve Goals: Good ADL Goals Pt Will Perform Grooming: with supervision;standing Pt Will Perform Lower Body Bathing: with min assist;sit to/from stand Pt Will Perform Lower Body Dressing: with min assist;sit to/from stand Pt Will Transfer to Toilet: with min guard assist;ambulating;bedside commode Pt Will Perform Toileting - Clothing Manipulation and hygiene: sit to/from stand;with min guard assist  OT Frequency: Min 2X/week    End of Session Equipment Utilized During Treatment: Rolling walker;Gait belt;Right knee immobilizer CPM Right Knee CPM Right Knee: Off Right Knee Flexion (Degrees): 60 Right Knee Extension (Degrees): 0  Activity Tolerance: Patient tolerated treatment well Patient left: in chair;with call bell/phone within reach   Time: 1711-1733 OT Time Calculation (min): 22 min Charges:  OT General Charges $OT Visit: 1 Procedure OT Evaluation $Initial OT Evaluation Tier I: 1 Procedure G-Codes:    Rae Lips 25-Dec-2014, 6:21 PM  Carney Living, OTR/L Occupational  Therapist [phone] (pager)

## 2014-12-18 NOTE — Progress Notes (Signed)
Subjective: 1 Day Post-Op Procedure(s) (LRB): RIGHT TOTAL KNEE ARTHROPLASTY (Right) Patient reports pain as moderate.  Labs not back yet.   Objective: Vital signs in last 24 hours: Temp:  [97.4 F (36.3 C)-98.6 F (37 C)] 98.5 F (36.9 C) (03/16 0517) Pulse Rate:  [47-78] 69 (03/16 0517) Resp:  [12-19] 19 (03/16 0517) BP: (89-119)/(48-85) 91/48 mmHg (03/16 0517) SpO2:  [93 %-100 %] 94 % (03/16 0517) Weight:  [91.627 kg (202 lb)] 91.627 kg (202 lb) (03/15 1139)  Intake/Output from previous day: 03/15 0701 - 03/16 0700 In: 3090 [P.O.:240; I.V.:2600; IV Piggyback:250] Out: 2100 [Urine:2050; Blood:50] Intake/Output this shift:    No results for input(s): HGB in the last 72 hours. No results for input(s): WBC, RBC, HCT, PLT in the last 72 hours. No results for input(s): NA, K, CL, CO2, BUN, CREATININE, GLUCOSE, CALCIUM in the last 72 hours. No results for input(s): LABPT, INR in the last 72 hours.  Sensation intact distally Intact pulses distally Dorsiflexion/Plantar flexion intact Incision: dressing C/D/I Compartment soft  Assessment/Plan: 1 Day Post-Op Procedure(s) (LRB): RIGHT TOTAL KNEE ARTHROPLASTY (Right) Up with therapy  Asherah Lavoy Y 12/18/2014, 7:42 AM

## 2014-12-19 ENCOUNTER — Encounter (HOSPITAL_COMMUNITY): Payer: Self-pay | Admitting: General Practice

## 2014-12-19 LAB — BASIC METABOLIC PANEL
ANION GAP: 7 (ref 5–15)
BUN: 10 mg/dL (ref 6–23)
CALCIUM: 8.4 mg/dL (ref 8.4–10.5)
CO2: 29 mmol/L (ref 19–32)
CREATININE: 0.84 mg/dL (ref 0.50–1.10)
Chloride: 99 mmol/L (ref 96–112)
GFR, EST AFRICAN AMERICAN: 77 mL/min — AB (ref >90)
GFR, EST NON AFRICAN AMERICAN: 66 mL/min — AB (ref >90)
Glucose, Bld: 119 mg/dL — ABNORMAL HIGH (ref 70–99)
Potassium: 3.6 mmol/L (ref 3.5–5.1)
SODIUM: 135 mmol/L (ref 135–145)

## 2014-12-19 LAB — CBC
HCT: 32.3 % — ABNORMAL LOW (ref 36.0–46.0)
Hemoglobin: 10.4 g/dL — ABNORMAL LOW (ref 12.0–15.0)
MCH: 28.7 pg (ref 26.0–34.0)
MCHC: 32.2 g/dL (ref 30.0–36.0)
MCV: 89 fL (ref 78.0–100.0)
Platelets: 160 10*3/uL (ref 150–400)
RBC: 3.63 MIL/uL — ABNORMAL LOW (ref 3.87–5.11)
RDW: 14.5 % (ref 11.5–15.5)
WBC: 6.2 10*3/uL (ref 4.0–10.5)

## 2014-12-19 LAB — TROPONIN I

## 2014-12-19 MED ORDER — SODIUM CHLORIDE 0.9 % IV BOLUS (SEPSIS)
500.0000 mL | Freq: Once | INTRAVENOUS | Status: AC
  Administered 2014-12-19: 500 mL via INTRAVENOUS

## 2014-12-19 NOTE — Progress Notes (Signed)
Subjective: 2 Days Post-Op Procedure(s) (LRB): RIGHT TOTAL KNEE ARTHROPLASTY (Right) Patient reports pain as moderate.  Slow progress with therapy who thus far are recommending ST-SNF.  Objective: Vital signs in last 24 hours: Temp:  [97.8 F (36.6 C)-98.7 F (37.1 C)] 98.7 F (37.1 C) (03/17 0447) Pulse Rate:  [84-89] 87 (03/17 0447) Resp:  [17-18] 17 (03/17 0447) BP: (111-132)/(55-85) 111/55 mmHg (03/17 0447) SpO2:  [97 %-100 %] 100 % (03/17 0447)  Intake/Output from previous day: 03/16 0701 - 03/17 0700 In: 980 [P.O.:980] Out: 650 [Urine:650] Intake/Output this shift:     Recent Labs  12/18/14 0902 12/19/14 0716  HGB 11.3* 10.4*    Recent Labs  12/18/14 0902 12/19/14 0716  WBC 5.3 6.2  RBC 3.94 3.63*  HCT 35.6* 32.3*  PLT 174 160    Recent Labs  12/18/14 0902  NA 139  K 3.7  CL 105  CO2 24  BUN 8  CREATININE 0.77  GLUCOSE 110*  CALCIUM 8.7   No results for input(s): LABPT, INR in the last 72 hours.  Sensation intact distally Intact pulses distally Dorsiflexion/Plantar flexion intact Incision: scant drainage No cellulitis present Compartment soft  Assessment/Plan: 2 Days Post-Op Procedure(s) (LRB): RIGHT TOTAL KNEE ARTHROPLASTY (Right) Up with therapy  Social Work consult  Lark Runk Y 12/19/2014, 10:01 AM

## 2014-12-19 NOTE — Progress Notes (Signed)
Physical Therapy Treatment Note  Clinical Impression:  Pt received in bed and was slow to respond to verbal cues/stimulation.  Pt demonstrated increased difficulty responding to questions.  Pt demonstrated Dyspnea Rating Score of 3/4  with standing, which persisted even after lying in bed.  HR during standing got up to 141 BPM.  Recommend d/c to SNF due to fall risk, fatigue/dyspnea and cognitive impairments that have affected progress towards PT goals.     12/19/14 1555  PT Visit Information  Last PT Received On 12/18/14  Assistance Needed +2  History of Present Illness Pt s/p R TKA 3/15. 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   PT Time Calculation  PT Start Time (ACUTE ONLY) 1555  PT Stop Time (ACUTE ONLY) 1617  PT Time Calculation (min) (ACUTE ONLY) 22 min  Subjective Data  Subjective Pt received in chair, slow to respond to verbal cues  Precautions  Precautions Knee;Fall  Required Braces or Orthoses Knee Immobilizer - Right  Knee Immobilizer - Right On when out of bed or walking  Restrictions  Weight Bearing Restrictions Yes  RLE Weight Bearing WBAT  Pain Assessment  Pain Assessment Faces  Faces Pain Scale 8  Pain Location R knee  Pain Descriptors / Indicators Grimacing  Pain Intervention(s) Monitored during session;Limited activity within patient's tolerance (Pt not able to verbalize pain)  Cognition  Arousal/Alertness Awake/alert  Behavior During Therapy (Decreased response verbally, decreased comprehension)  Overall Cognitive Status History of cognitive impairments - at baseline  Area of Impairment Attention;Memory;Following commands;Awareness;Problem solving  Problem Solving Slow processing;Difficulty sequencing;Requires verbal cues;Requires tactile cues  General Comments Pt seems less responsive to stimulation, difficulty communicating coherently, able to recall president and DOB  Bed Mobility  Overal bed mobility Needs Assistance  Bed Mobility Sit to  Supine  Supine to sit +2 for physical assistance;Min assist;Mod assist  General bed mobility comments Pt grimacing with pain upon getting into bed, not able to verbalize pain  Transfers  Overall transfer level Needs assistance  Equipment used Rolling walker (2 wheeled)  Transfers Sit to/from Stand  Sit to Stand Mod assist;+2 safety/equipment  Stand pivot transfers +2 physical assistance;Mod assist  General transfer comment HR up to 141 while standing, difficulty sequencing and following directions for transfer  Balance  Overall balance assessment Needs assistance  Standing balance support Bilateral upper extremity supported  Standing balance-Leahy Scale Poor  Standing balance comment Hands on assist and B UE RW for safe standing  General Comments  General comments (skin integrity, edema, etc.) Dyspnea Scale Rating 3/4 during transfer and post-transfer.  HR got up to 141 while standing.  Exercises  Exercises (Held due to HR 141)  PT - End of Session  Equipment Utilized During Treatment Gait belt;Right knee immobilizer  Activity Tolerance Patient limited by fatigue  Patient left in bed;with call bell/phone within reach  Nurse Communication Mobility status  PT - Assessment/Plan  PT Plan Discharge plan needs to be updated  PT Frequency (ACUTE ONLY) 7X/week  Follow Up Recommendations SNF  PT equipment None recommended by PT  PT Goal Progression  Progress towards PT goals Not progressing toward goals - comment (Barriers to progress are cognition and fatigue/dyspnea)    Vella RaringKailee Raymont Andreoni, SPT (student physical therapist) Office phone: [phone]

## 2014-12-19 NOTE — Progress Notes (Signed)
Patient ID: Gina SpeedBernice C Bosshart, female   DOB: 1940-10-27, 74 y.o.   MRN: [id number] Patient has developed some tachycardia, but denies any chest pain or shortness of breath.  Her BP is stable and good sats.  EKG is sinus.  Will bolus normal saline 500 cc as well as check bmet and trop one time.

## 2014-12-19 NOTE — Progress Notes (Signed)
At 1333 the patient's pulse rate was 131. When the pulse was rechecked at 1340 it was 89. At 1540 the patient's pulse is 139. Will contact the doctor and continue to monitor patient.

## 2014-12-19 NOTE — Progress Notes (Signed)
Physical Therapy Treatment Patient Details Name: Gina Parker MRN: [id number] DOB: 03-04-1941 Today's Date: 12/19/2014    History of Present Illness Pt s/p R TKA 3/15. 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     PT Comments    Pt demonstrated a dyspnea score of 3 with bed mobility and basic transfers.  Pt needed moderate assist x2 for out of bed activity.  Pt currently a fall risk due to decreased ambulation speed.  Would recommend ST-SNF to address remaining functional deficits that would hinder safety with return to home.    Follow Up Recommendations  ST-SNF     Equipment Recommendations  None recommended by PT    Recommendations for Other Services       Precautions / Restrictions Precautions Precautions: Knee;Fall Required Braces or Orthoses: Knee Immobilizer - Right Knee Immobilizer - Right: On when out of bed or walking Restrictions Weight Bearing Restrictions: Yes RLE Weight Bearing: Weight bearing as tolerated    Mobility  Bed Mobility Overal bed mobility: Needs Assistance;+ 2 for safety/equipment Bed Mobility: Supine to Sit     Supine to sit: Supervision     General bed mobility comments: Pt took over 5 minutes and maximal effort with use of handrails to get from supine to EOB, but was able to do so without hands on assist  Transfers Overall transfer level: Needs assistance Equipment used: Rolling walker (2 wheeled) Transfers: Sit to/from Stand Sit to Stand: Mod assist;+2 safety/equipment Stand pivot transfers: +2 physical assistance;Mod assist       General transfer comment: Moderate assist for sit to stand, mod/max assist with multiple steps to pivot to chair  Ambulation/Gait Ambulation/Gait assistance: +2 physical assistance;Mod assist;Max assist (+3 for chair management) Ambulation Distance (Feet): 5 Feet Assistive device: Rolling walker (2 wheeled) Gait Pattern/deviations: Steppage;Decreased stride length;Step-to  pattern Gait velocity: Extremely slow Gait velocity interpretation: <1.8 ft/sec, indicative of risk for recurrent falls General Gait Details: max directional v/c's. freq rest breaks   Stairs            Wheelchair Mobility    Modified Rankin (Stroke Patients Only)       Balance Overall balance assessment: Needs assistance         Standing balance support: Bilateral upper extremity supported (With 2+ external support) Standing balance-Leahy Scale: Poor Standing balance comment: Pt requires hands on assist and B UE on RW for safe standing/ambulation                    Cognition Arousal/Alertness: Awake/alert (Slightly lethargic) Behavior During Therapy: WFL for tasks assessed/performed Overall Cognitive Status: History of cognitive impairments - at baseline                 General Comments: pt with baseline delayed processing    Exercises      General Comments General comments (skin integrity, edema, etc.): Dyspnea Scale Rating: 3/4      Pertinent Vitals/Pain Pain Assessment: 0-10 Pain Score: 8  Pain Location: R knee Pain Descriptors / Indicators: Grimacing Pain Intervention(s): Monitored during session    Home Living                      Prior Function            PT Goals (current goals can now be found in the care plan section) Progress towards PT goals: Progressing toward goals    Frequency  7X/week    PT Plan Current  plan remains appropriate    Co-evaluation             End of Session Equipment Utilized During Treatment: Gait belt;Right knee immobilizer Activity Tolerance: Patient limited by fatigue Patient left: in chair;with chair alarm set;with call bell/phone within reach     Time: 1126-1155 PT Time Calculation (min) (ACUTE ONLY): 29 min  Charges:                       G Codes:      Yarelli Decelles 12/19/2014, 12:17 PM  Vella RaringKailee Arlyss Weathersby, SPT (student physical therapist) Office phone: [phone]

## 2014-12-20 ENCOUNTER — Encounter (HOSPITAL_COMMUNITY): Payer: Self-pay | Admitting: Radiology

## 2014-12-20 ENCOUNTER — Inpatient Hospital Stay (HOSPITAL_COMMUNITY): Payer: Medicare HMO

## 2014-12-20 DIAGNOSIS — Z96651 Presence of right artificial knee joint: Secondary | ICD-10-CM

## 2014-12-20 DIAGNOSIS — M7989 Other specified soft tissue disorders: Secondary | ICD-10-CM

## 2014-12-20 LAB — CBC
HEMATOCRIT: 32.2 % — AB (ref 36.0–46.0)
HEMOGLOBIN: 10.3 g/dL — AB (ref 12.0–15.0)
MCH: 28.4 pg (ref 26.0–34.0)
MCHC: 32 g/dL (ref 30.0–36.0)
MCV: 88.7 fL (ref 78.0–100.0)
Platelets: 173 10*3/uL (ref 150–400)
RBC: 3.63 MIL/uL — ABNORMAL LOW (ref 3.87–5.11)
RDW: 14.5 % (ref 11.5–15.5)
WBC: 6.9 10*3/uL (ref 4.0–10.5)

## 2014-12-20 MED ORDER — IOHEXOL 350 MG/ML SOLN
100.0000 mL | Freq: Once | INTRAVENOUS | Status: AC | PRN
  Administered 2014-12-20: 80 mL via INTRAVENOUS

## 2014-12-20 MED ORDER — RIVAROXABAN 10 MG PO TABS: 10.0000 mg | ORAL_TABLET | Freq: Every day | ORAL | Status: AC

## 2014-12-20 MED ORDER — OXYCODONE-ACETAMINOPHEN 5-325 MG PO TABS: 1.0000 | ORAL_TABLET | ORAL | Status: AC | PRN

## 2014-12-20 NOTE — Progress Notes (Signed)
Orthopedic Tech Progress Note Patient Details:  Gina Parker 08-12-1941 [id number] On cpm at 3:00 pm [name], female   DOB: 08-12-1941, 74 y.o.   MRN: [id number]   [name], Leily Capek Craig 12/20/2014, 3:02 PM

## 2014-12-20 NOTE — Progress Notes (Signed)
[name], female   DOB: 12-May-1941, 74 y.o.   MRN: [id number] I just spoke with the patient and she wants to go home with family.  If family is willing to accept that risk, then discharge over the week end.  If not, then SNF Monday.

## 2014-12-20 NOTE — Progress Notes (Signed)
12/20/14 PT/OT recommended SNF. Referral made to CSW, CSW working on SNF placement. Will follow until d/c.

## 2014-12-20 NOTE — Progress Notes (Signed)
Right lower extremity venous duplex completed. No evidence for DVT, SVT, or Baker's cyst. °Brianna L Mazza,RVT °

## 2014-12-20 NOTE — Progress Notes (Signed)
[name], female   DOB: 1941-09-06, 74 y.o.   MRN: [id number] CTA negative for PE and venous doppler negative for DVT.  Vitals otherwise stable as well as her labs.  She denies chest pain and denies SOB.  Therapy is recommending SNF due to fall risk.  Part of family wants her home instead.  I did sign her FL-2.  Will now likely be here thru the weekend.

## 2014-12-20 NOTE — Progress Notes (Signed)
PT Cancellation Note  Patient Details Name: Gina Parker MRN: [id number] DOB: Jun 25, 1941   Cancelled Treatment:    Reason Eval/Treat Not Completed: Medical issues which prohibited therapy;Patient at procedure or test/unavailable.  Patient in testing.  Per MD note, to r/o DVT and PE.  Will return at later time for PT session as appropriate.   Vena AustriaDavis, Kainon Varady H 12/20/2014, 12:59 PM Durenda HurtSusan H. Renaldo Fiddleravis, PT, [facility] Acute [facility] Services Pager [phone]

## 2014-12-20 NOTE — Clinical Social Work Placement (Addendum)
Clinical Social Work Department CLINICAL SOCIAL WORK PLACEMENT NOTE 12/20/2014  Patient:  Gina Parker,Gina Parker  Account Number:  [id number] Admit date:  04/11/2009  Clinical Social Worker:  Mosie EpsteinEMILY S Taniela Feltus, LCSWA  Date/time:  12/20/2014 04:08 PM  Clinical Social Work is seeking post-discharge placement for this patient at the following level of care:   SKILLED NURSING   (*CSW will update this form in Epic as items are completed)   12/20/2014  [facility]/family provided with [facility] Department of Clinical Social Work's list of facilities offering this level of care within the geographic area requested by the patient (or if unable, by the patient's family).  12/20/2014  Patient/family informed of their freedom to choose among providers that offer the needed level of care, that participate in Medicare, Medicaid or managed care program needed by the patient, have an available bed and are willing to accept the patient.  12/20/2014  Patient/family informed of [facility]' ownership interest in [facility], as well as of the fact that they are under no obligation to receive care at this facility.  PASARR submitted to EDS on 12/20/2014 PASARR number received on 12/20/2014  FL2 transmitted to all facilities in geographic area requested by pt/family on  12/20/2014 FL2 transmitted to all facilities within larger geographic area on   Patient informed that his/her managed care company has contracts with or will negotiate with  certain facilities, including the following:     Patient/family informed of bed offers received:  12/22/2014 [facility] and patient chooses bed at    [facility]  12/24/2014 Patient to be transferred to facility by [facility] Patient and family notified of transfer on 12/24/2014 Name of family member notified:  Patient updated at bedside. Patient alert and oriented.  The following physician  request were entered in Epic:   Additional Comments:  Lily Kochermily Oseas Detty, LCSWA [phone]) Licensed Clinical Social Worker Orthopedics [phone]([id number]) and Surgical [phone]([id number])

## 2014-12-20 NOTE — Progress Notes (Signed)
[name], female   DOB: November 23, 1940, 74 y.o.   MRN: [id number] Due to persistent tachycardia as well as calf pain, will need to rule out a DVT as well as PE.  Labs normal thus far.  Therapy is recommending SNF placement due to fall risk.  Needs FL-@ on chart.  Dressing otherwise clean on right knee.  Vitals stable other than tachycardia.

## 2014-12-20 NOTE — Clinical Social Work Psychosocial (Signed)
Clinical Social Work Department BRIEF PSYCHOSOCIAL ASSESSMENT 12/20/2014  Patient:  Gina Parker,Gina Parker     Account Number:  [id number]     Admit date:  04/11/2009  Clinical Social Worker:  Mosie EpsteinVAUGHN,Allaina Brotzman S, LCSWA  Date/Time:  12/20/2014 04:02 PM  Referred by:  Physician  Date Referred:  12/20/2014 Referred for  SNF Placement   Other Referral:   none.   Interview type:  Family Other interview type:   CSW spoke with patient's two daughters regarding discharge disposition.    PSYCHOSOCIAL DATA Living Status:  ALONE Admitted from facility:   Level of care:   Primary support name:  Gina Parker Primary support relationship to patient:  CHILD, ADULT Degree of support available:   Strong support system.    CURRENT CONCERNS Current Concerns  Post-Acute Placement   Other Concerns:   none.    SOCIAL WORK ASSESSMENT / PLAN CSW received referral for possible SNF placement at time of discharge. CSW attempted to meet with patient at bedside to discuss discharge dispositions. Patient currently drowsy and unable to participate in assessment. Patient ageed to have CSW speak with patient's daughters. CSW spoke with patient's daughter, Gina Parker, who informed CSW patient's other daughter, Gina Parker, was the primary caregiver to patient.    CSW spoke with patient's daughter, Gina Parker, regarding PT recommendation for SNF placement. Patient's daughter stated she was agreeable to SNF placement for patient at time of discharge. Patient's daughter expressed slight concern regarding SNF placement, as patient's daughter recalled a lack of care at a SNF last time patient was admitted to a SNF. CSW provided support to patient's daughter.    CSW to continue to follow and assist with discharge planning needs.   Assessment/plan status:  Psychosocial Support/Ongoing Assessment of Needs Other assessment/ plan:   none.   Information/referral to community resources:   [facility]    PATIENT'S/FAMILY'S  RESPONSE TO PLAN OF CARE: Patient's family understanding and agreeable to CSW plan of care. Patient's family expressed no further questions or concerns at this time.       Marcelline Deistmily Aleicia Kenagy, LCSWA [phone]) Licensed Clinical Social Worker Orthopedics [phone]([id number]) and Surgical [phone]([id number])

## 2014-12-20 NOTE — Discharge Instructions (Addendum)
Rivaroxaban oral tablets °What is this medicine? °RIVAROXABAN (ri va ROX a ban) is an anticoagulant (blood thinner). It is used to treat blood clots in the lungs or in the veins. It is also used after knee or hip surgeries to prevent blood clots. It is also used to lower the chance of stroke in people with a medical condition called atrial fibrillation. °This medicine may be used for other purposes; ask your health care provider or pharmacist if you have questions. °COMMON BRAND NAME(S): Xarelto, Xarelto Starter Pack °What should I tell my health care provider before I take this medicine? °They need to know if you have any of these conditions: °-bleeding disorders °-bleeding in the brain °-blood in your stools (black or tarry stools) or if you have blood in your vomit °-history of stomach bleeding °-kidney disease °-liver disease °-low blood counts, like low white cell, platelet, or red cell counts °-recent or planned spinal or epidural procedure °-take medicines that treat or prevent blood clots °-an unusual or allergic reaction to rivaroxaban, other medicines, foods, dyes, or preservatives °-pregnant or trying to get pregnant °-breast-feeding °How should I use this medicine? °Take this medicine by mouth with a glass of water. Follow the directions on the prescription label. Take your medicine at regular intervals. Do not take it more often than directed. Do not stop taking except on your doctor's advice. Stopping this medicine may increase your risk of a blot clot. Be sure to refill your prescription before you run out of medicine. °If you are taking this medicine after hip or knee replacement surgery, take it with or without food. If you are taking this medicine for atrial fibrillation, take it with your evening meal. If you are taking this medicine to treat blood clots, take it with food at the same time each day. If you are unable to swallow your tablet, you may crush the tablet and mix it in applesauce. Then,  immediately eat the applesauce. You should eat more food right after you eat the applesauce containing the crushed tablet. °Talk to your pediatrician regarding the use of this medicine in children. Special care may be needed. °Overdosage: If you think you have taken too much of this medicine contact a poison control center or emergency room at once. °NOTE: This medicine is only for you. Do not share this medicine with others. °What if I miss a dose? °If you take your medicine once a day and miss a dose, take the missed dose as soon as you remember. If you take your medicine twice a day and miss a dose, take the missed dose immediately. In this instance, 2 tablets may be taken at the same time. The next day you should take 1 tablet twice a day as directed. °What may interact with this medicine? °-aspirin and aspirin-like medicines °-certain antibiotics like erythromycin, azithromycin, and clarithromycin °-certain medicines for fungal infections like ketoconazole and itraconazole °-certain medicines for irregular heart beat like amiodarone, quinidine, dronedarone °-certain medicines for seizures like carbamazepine, phenytoin °-certain medicines that treat or prevent blood clots like warfarin, enoxaparin, and dalteparin °-conivaptan °-diltiazem °-felodipine °-indinavir °-lopinavir; ritonavir °-NSAIDS, medicines for pain and inflammation, like ibuprofen or naproxen °-ranolazine °-rifampin °-ritonavir °-St. John's wort °-verapamil °This list may not describe all possible interactions. Give your health care provider a list of all the medicines, herbs, non-prescription drugs, or dietary supplements you use. Also tell them if you smoke, drink alcohol, or use illegal drugs. Some items may interact with your medicine. °What   should I watch for while using this medicine? Visit your doctor or health care professional for regular checks on your progress. Your condition will be monitored carefully while you are receiving this  medicine. Notify your doctor or health care professional and seek emergency treatment if you develop breathing problems; changes in vision; chest pain; severe, sudden headache; pain, swelling, warmth in the leg; trouble speaking; sudden numbness or weakness of the face, arm, or leg. These can be signs that your condition has gotten worse. If you are going to have surgery, tell your doctor or health care professional that you are taking this medicine. Tell your health care professional that you use this medicine before you have a spinal or epidural procedure. Sometimes people who take this medicine have bleeding problems around the spine when they have a spinal or epidural procedure. This bleeding is very rare. If you have a spinal or epidural procedure while on this medicine, call your health care professional immediately if you have back pain, numbness or tingling (especially in your legs and feet), muscle weakness, paralysis, or loss of bladder or bowel control. Avoid sports and activities that might cause injury while you are using this medicine. Severe falls or injuries can cause unseen bleeding. Be careful when using sharp tools or knives. Consider using an Neurosurgeonelectric razor. Take special care brushing or flossing your teeth. Report any injuries, bruising, or red spots on the skin to your doctor or health care professional. What side effects may I notice from receiving this medicine? Side effects that you should report to your doctor or health care professional as soon as possible: -allergic reactions like skin rash, itching or hives, swelling of the face, lips, or tongue -back pain -redness, blistering, peeling or loosening of the skin, including inside the mouth -signs and symptoms of bleeding such as bloody or black, tarry stools; red or dark-brown urine; spitting up blood or brown material that looks like coffee grounds; red spots on the skin; unusual bruising or bleeding from the eye, gums, or  nose Side effects that usually do not require medical attention (Report these to your doctor or health care professional if they continue or are bothersome.): -dizziness -muscle pain This list may not describe all possible side effects. Call your doctor for medical advice about side effects. You may report side effects to FDA at 1-800-FDA-1088. Where should I keep my medicine? Keep out of the reach of children. Store at room temperature between 15 and 30 degrees C (59 and 86 degrees F). Throw away any unused medicine after the expiration date. NOTE: This sheet is a summary. It may not cover all possible information. If you have questions about this medicine, talk to your doctor, pharmacist, or health care provider.  2015, Elsevier/Gold Standard. (2014-01-10 18:47:48)   FULL WEIGHT BEARING AS TOLERATED RIGHT KNEE. WORK ON KNEE MOTION. ICE AS NEEDED FOR SWELLING. DO NOT REMOVE THE CURRENT KNEE DRESSING. CAN GET CURRENT DRESSING WET DAILY IN THE SHOWER

## 2014-12-21 LAB — CBC
HCT: 32.2 % — ABNORMAL LOW (ref 36.0–46.0)
Hemoglobin: 10.3 g/dL — ABNORMAL LOW (ref 12.0–15.0)
MCH: 28.5 pg (ref 26.0–34.0)
MCHC: 32 g/dL (ref 30.0–36.0)
MCV: 89.2 fL (ref 78.0–100.0)
Platelets: 196 10*3/uL (ref 150–400)
RBC: 3.61 MIL/uL — ABNORMAL LOW (ref 3.87–5.11)
RDW: 14.4 % (ref 11.5–15.5)
WBC: 6.7 10*3/uL (ref 4.0–10.5)

## 2014-12-21 NOTE — Progress Notes (Signed)
Pt stable - walking in room Hr increased to 135 - no cp or sob - sinus tach no afib No dizziness - hgb ok Will need to watch one more day to make sure hr trending down Probable dc am

## 2014-12-21 NOTE — Progress Notes (Signed)
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     PT Comments    Patient making some good progress today but still requires increased time for bed mobility and fatigues quickly. Will attempt increased ambulation this afternoon  Follow Up Recommendations  SNF     Equipment Recommendations  None recommended by PT    Recommendations for Other Services       Precautions / Restrictions Precautions Precautions: Knee;Fall Required Braces or Orthoses: Knee Immobilizer - Right Knee Immobilizer - Right: On when out of bed or walking Restrictions Weight Bearing Restrictions: Yes RLE Weight Bearing: Weight bearing as tolerated    Mobility  Bed Mobility Overal bed mobility: Needs Assistance Bed Mobility: Sit to Supine     Supine to sit: Min guard     General bed mobility comments: Patient able to complete on her own with increased time.   Transfers Overall transfer level: Needs assistance Equipment used: Rolling walker (2 wheeled)   Sit to Stand: Min assist         General transfer comment: Min A to ensure stability. Increased anterior shift with stand. Cues for correct hand placement and technique  Ambulation/Gait Ambulation/Gait assistance: Min assist Ambulation Distance (Feet): 15 Feet Assistive device: Rolling walker (2 wheeled) Gait Pattern/deviations: Step-to pattern;Decreased step length - right;Decreased step length - left   Gait velocity interpretation: Below normal speed for age/gender General Gait Details: Short steps with cues to increase. Cues for RW usage and safety. Min A to ensure stability. Fatigues quickly   Information systems managertairs            Wheelchair Mobility    Modified Rankin (Stroke Patients Only)       Balance                                     Cognition Arousal/Alertness: Awake/alert Behavior During Therapy: WFL for tasks assessed/performed Overall Cognitive Status: History of cognitive impairments - at baseline       Memory: Decreased short-term memory              Exercises Total Joint Exercises Quad Sets: AROM;Right;10 reps;Supine Heel Slides: Right;AAROM;10 reps;Seated    General Comments        Pertinent Vitals/Pain Pain Score: 5  Pain Location: R knee Pain Descriptors / Indicators: Sore;Aching Pain Intervention(s): Monitored during session    Home Living                      Prior Function            PT Goals (current goals can now be found in the care plan section) Progress towards PT goals: Progressing toward goals    Frequency  7X/week    PT Plan Current plan remains appropriate    Co-evaluation             End of Session Equipment Utilized During Treatment: Gait belt;Right knee immobilizer Activity Tolerance: Patient limited by fatigue Patient left: in chair;with call bell/phone within reach     Time: 0840-0908 PT Time Calculation (min) (ACUTE ONLY): 28 min  Charges:  $Gait Training: 8-22 mins $Therapeutic Exercise: 8-22 mins  G Codes:      Fredrich Birks 12/21/2014, 11:59 AM 12/21/2014 Fredrich Birks PTA [phone] pager [phone] office

## 2014-12-21 NOTE — Progress Notes (Signed)
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     PT Comments    Patient is progressing well with ambulation. Patient is wanting to DC home vs. SNF however unsure of families ability to assist at home. No family present today. Will continue to recommend SNF until can ensure patient has 24/7 assist at home.   Follow Up Recommendations  SNF     Equipment Recommendations  None recommended by PT    Recommendations for Other Services       Precautions / Restrictions Precautions Precautions: Knee;Fall Required Braces or Orthoses: Knee Immobilizer - Right Knee Immobilizer - Right: On when out of bed or walking Restrictions RLE Weight Bearing: Weight bearing as tolerated    Mobility  Bed Mobility Overal bed mobility: Needs Assistance Bed Mobility: Sit to Supine     Supine to sit: Supervision     General bed mobility comments: supervision with increased effort but does not want assist.   Transfers Overall transfer level: Needs assistance Equipment used: Rolling walker (2 wheeled)   Sit to Stand: Min assist         General transfer comment: Min A to ensure stability. Cues for correct hand placement and technique. Patient tends to pull up on RW  Ambulation/Gait Ambulation/Gait assistance: Min guard Ambulation Distance (Feet): 60 Feet Assistive device: Rolling walker (2 wheeled) Gait Pattern/deviations: Step-through pattern;Decreased stride length Gait velocity: decreased Gait velocity interpretation: Below normal speed for age/gender General Gait Details: patient with better step length this session. Cues for upright posture and safety with RW   Stairs            Wheelchair Mobility    Modified Rankin (Stroke Patients Only)       Balance                                    Cognition Arousal/Alertness: Awake/alert Behavior During Therapy: WFL for tasks assessed/performed Overall Cognitive Status: History of cognitive impairments - at baseline       Memory: Decreased short-term memory              Exercises Total Joint Exercises Quad Sets: AROM;Right;10 reps;Supine Heel Slides: Right;AAROM;10 reps;Seated    General Comments        Pertinent Vitals/Pain Pain Score: 4  Pain Location: R knee Pain Descriptors / Indicators: Aching;Sore Pain Intervention(s): Monitored during session    Home Living                      Prior Function            PT Goals (current goals can now be found in the care plan section) Progress towards PT goals: Progressing toward goals    Frequency  7X/week    PT Plan Current plan remains appropriate    Co-evaluation             End of Session Equipment Utilized During Treatment: Gait belt;Right knee immobilizer Activity Tolerance: Patient tolerated treatment well Patient left: in chair;with call bell/phone within reach     Time: 1316-1340 PT Time Calculation (min) (ACUTE ONLY): 24 min  Charges:  $Gait Training: 8-22 mins $Therapeutic Exercise: 8-22 mins $Therapeutic Activity: 8-22 mins  G Codes:      Fredrich Birks 12/21/2014, 1:46 PM  12/21/2014 Fredrich Birks PTA [phone] pager [phone] office

## 2014-12-21 NOTE — Progress Notes (Signed)
Patient Pulse rate has been high during this shift.  Nurse spoke to Dr. August Saucerean. CBC order received. MD stated that if HGB >10, proceed to do EKG.

## 2014-12-21 NOTE — Progress Notes (Signed)
CBC obtained, HGB 10.3. EKG result sinus tachycardia.

## 2014-12-22 NOTE — Progress Notes (Signed)
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     PT Comments    Pt very pleasant & willing to participate in PT session.  Per chart review, looks as though family has decided for SNF for pt prior to d/c home.  Cont with current POC & d/c recommendation of SNF.     Follow Up Recommendations  SNF     Equipment Recommendations  None recommended by PT    Recommendations for Other Services       Precautions / Restrictions Precautions Precautions: Knee;Fall Required Braces or Orthoses: Knee Immobilizer - Right Knee Immobilizer - Right: On when out of bed or walking Restrictions RLE Weight Bearing: Weight bearing as tolerated    Mobility  Bed Mobility Overal bed mobility: Needs Assistance Bed Mobility: Supine to Sit     Supine to sit: Min assist     General bed mobility comments: (A) for RLE management  Transfers Overall transfer level: Needs assistance Equipment used: Rolling walker (2 wheeled) Transfers: Sit to/from Stand Sit to Stand: Min assist         General transfer comment: (A) to achieve standing & ensure stabilityi.  Cues for hand placement.    Ambulation/Gait Ambulation/Gait assistance: Min guard Ambulation Distance (Feet): 60 Feet Assistive device: Rolling walker (2 wheeled) Gait Pattern/deviations: Step-through pattern;Decreased step length - right;Decreased step length - left;Decreased weight shift to right Gait velocity: decreased   General Gait Details: cues for sequencing & tall posture.     Stairs            Wheelchair Mobility    Modified Rankin (Stroke Patients Only)       Balance                                    Cognition Arousal/Alertness: Awake/alert Behavior During Therapy: WFL for tasks assessed/performed Overall  Cognitive Status: History of cognitive impairments - at baseline Area of Impairment: Attention;Memory;Following commands;Awareness;Problem solving     Memory: Decreased short-term memory              Exercises Total Joint Exercises Ankle Circles/Pumps: AROM;Both;10 reps Quad Sets: AROM;Both;10 reps Hip ABduction/ADduction: AAROM;Strengthening;Right;10 reps Straight Leg Raises: AAROM;Strengthening;Right;10 reps    General Comments        Pertinent Vitals/Pain Pain Assessment: 0-10 Pain Score: 7  Pain Location: Rt knee Pain Descriptors / Indicators: Aching;Constant Pain Intervention(s): Monitored during session;Premedicated before session;Repositioned    Home Living                      Prior Function            PT Goals (current goals can now be found in the care plan section) Acute Rehab PT Goals PT Goal Formulation: With patient Time For Goal Achievement: 12/25/14 Potential to Achieve Goals: Good Progress towards PT goals: Progressing toward goals    Frequency  7X/week    PT Plan Current plan remains appropriate    Co-evaluation             End of Session Equipment Utilized During Treatment: Gait belt;Right knee immobilizer Activity Tolerance: Patient tolerated treatment well Patient left: in chair;with call bell/phone within reach     Time: 4696-29521108-1138 PT Time Calculation (  min) (ACUTE ONLY): 30 min  Charges:  $Gait Training: 8-22 mins $Therapeutic Activity: 8-22 mins                    G Codes:      Lara Mulch 12/22/2014, 12:19 PM   Verdell Face, PTA [phone] 12/22/2014

## 2014-12-22 NOTE — Clinical Social Work Note (Signed)
CSW attempted to contact patient's daughter Lonna CobbLisa Staszewski [phone]) and left a message for follow-up. CSW contacted patient's daughter Dennie MaizesKathy Hout [phone]) and spoke with her. Per Olegario MessierKathy, she will relay message to Misty StanleyLisa to follow-up with CSW. CSW visited patient's room, however, no one was present at bedside. CSW pending follow-up from [place] to provide bed offers.  Tonja Jezewski Patrick-[place], LCSWA Weekend Clinical Social Worker [phone]

## 2014-12-22 NOTE — Progress Notes (Signed)
Orthopedic Tech Progress Note Patient Details:  Gina Parker Feb 11, 1941 [id number] On cpm at 5:20 pm Patient ID: Gina SpeedBernice C Parker, female   DOB: Feb 11, 1941, 74 y.o.   MRN: [id number]   [name], Tron Flythe Craig 12/22/2014, 5:22 PM

## 2014-12-22 NOTE — Clinical Social Work Note (Signed)
CSW contacted by patient's daughter Misty StanleyLisa and provided bed offers. Per Misty StanleyLisa, she will review bed offers and research them online because [place] not able [place] physically tour facilities. Misty StanleyLisa states she will follow-up with CSW in the am on Monday, 3/21 with bed choice. CSW to follow tomorrow.  Divine Imber Patrick-Jefferson, LCSWA Weekend Clinical Social Worker [phone]

## 2014-12-22 NOTE — Progress Notes (Signed)
Subjective: Pt stable but slow to mobilize   Objective: Vital signs in last 24 hours: Temp:  [98.2 F (36.8 C)-98.3 F (36.8 C)] 98.2 F (36.8 C) (03/20 0609) Pulse Rate:  [93-128] 128 (03/20 0609) Resp:  [16-18] 18 (03/20 0609) BP: (95-116)/(56-73) 116/73 mmHg (03/20 0609) SpO2:  [94 %-97 %] 94 % (03/20 0609)  Intake/Output from previous day: 03/19 0701 - 03/20 0700 In: 760 [P.O.:760] Out: -  Intake/Output this shift: Total I/O In: 120 [P.O.:120] Out: -   Exam:  Sensation intact distally Intact pulses distally  Labs:  Recent Labs  12/20/14 0457 12/21/14 0349  HGB 10.3* 10.3*    Recent Labs  12/20/14 0457 12/21/14 0349  WBC 6.9 6.7  RBC 3.63* 3.61*  HCT 32.2* 32.2*  PLT 173 196    Recent Labs  12/19/14 1910  NA 135  K 3.6  CL 99  CO2 29  BUN 10  CREATININE 0.84  GLUCOSE 119*  CALCIUM 8.4   No results for input(s): LABPT, INR in the last 72 hours.  Assessment/Plan: Looks like pt will need snf this week   DEAN,GREGORY SCOTT 12/22/2014, 10:43 AM

## 2014-12-23 ENCOUNTER — Inpatient Hospital Stay (HOSPITAL_COMMUNITY): Payer: Medicare HMO

## 2014-12-23 NOTE — Progress Notes (Signed)
Subjective: 6 Days Post-Op Procedure(s) (LRB): RIGHT TOTAL KNEE ARTHROPLASTY (Right) Patient reports pain as moderate.  Vitals normal.  Slow progress with PT.  SNF recommended.  Objective: Vital signs in last 24 hours: Temp:  [98.6 F (37 C)-99 F (37.2 C)] 98.6 F (37 C) (03/21 0625) Pulse Rate:  [88-90] 88 (03/21 0625) Resp:  [16-18] 18 (03/21 0625) BP: (110-112)/(63-73) 112/63 mmHg (03/21 0625) SpO2:  [94 %-98 %] 98 % (03/21 0625)  Intake/Output from previous day: 03/20 0701 - 03/21 0700 In: 600 [P.O.:600] Out: -  Intake/Output this shift:     Recent Labs  12/21/14 0349  HGB 10.3*    Recent Labs  12/21/14 0349  WBC 6.7  RBC 3.61*  HCT 32.2*  PLT 196   No results for input(s): NA, K, CL, CO2, BUN, CREATININE, GLUCOSE, CALCIUM in the last 72 hours. No results for input(s): LABPT, INR in the last 72 hours.  Sensation intact distally Intact pulses distally Dorsiflexion/Plantar flexion intact Incision: no drainage No cellulitis present Compartment soft  Assessment/Plan: 6 Days Post-Op Procedure(s) (LRB): RIGHT TOTAL KNEE ARTHROPLASTY (Right) Discharge to SNF today.  Kathryne HitchBLACKMAN,Lauria Depoy Y 12/23/2014, 7:41 AM

## 2014-12-23 NOTE — Progress Notes (Signed)
Attempted to reach family members Lonna CobbLisa Panchal at [phone] and Dennie MaizesKathy Rio at [phone] to inform them that patient was discovered sitting on the floor in her room next to her bed. First # was disconnected and second number would not go through.. Nurse Bindu RN attempted as well.

## 2014-12-23 NOTE — Progress Notes (Signed)
Patient ID: Gina SpeedBernice C Byrum, female   DOB: 1941-04-13, 74 y.o.   MRN: [id number] I reviewed her current knee films and compared them to her immediate post-op films from last week.  This doe NOT appear to be a fracture.  I will let her continue to put full weight as tolerated on her right leg.  This does confirm that she needs short-term skilled nursing.

## 2014-12-23 NOTE — Progress Notes (Signed)
Called on call at [phone] regarding patient being discovered sitting on the floor at the side of her bed by nurse walking by her room. It was an unwitnessed event. No injuries reported by patient.

## 2014-12-23 NOTE — Progress Notes (Signed)
Physical Therapy Treatment Patient Details Name: Gina Parker MRN: [id number] DOB: April 04, 1941 Today's Date: 12/23/2014    History of Present Illness Pt s/p R TKA 3/15. 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     PT Comments    Patient making slow progress with mobility and gait.  Agree with need for SNF at discharge.  Follow Up Recommendations  SNF     Equipment Recommendations  None recommended by PT    Recommendations for Other Services       Precautions / Restrictions Precautions Precautions: Knee;Fall Precaution Comments: Reviewed precautions Required Braces or Orthoses: Knee Immobilizer - Right Knee Immobilizer - Right: On when out of bed or walking Restrictions Weight Bearing Restrictions: Yes RLE Weight Bearing: Weight bearing as tolerated    Mobility  Bed Mobility Overal bed mobility: Needs Assistance Bed Mobility: Supine to Sit     Supine to sit: Min assist     General bed mobility comments: Placed towel on bed to prevent KI from becoming soiled.  Patient required max assist to don KI on RLE.  Verbal cues for bed mobility.  Assist to move RLE off of bed.  Patient able to use UE's to move trunk to sitting position.  Assist to scoot to EOB.  Transfers Overall transfer level: Needs assistance Equipment used: Rolling walker (2 wheeled) Transfers: Sit to/from Stand Sit to Stand: Min assist         General transfer comment: Repeated cues needed for hand placement during transfers.  Min assist to power up to standing from bed and toilet, and for balance initially.  Ambulation/Gait Ambulation/Gait assistance: Min guard;Min assist Ambulation Distance (Feet): 46 Feet Assistive device: Rolling walker (2 wheeled) Gait Pattern/deviations: Step-to pattern;Decreased stance time - right;Decreased step length - left;Decreased weight shift to right;Shuffle;Antalgic;Trunk flexed Gait velocity: decreased Gait velocity interpretation: Below normal  speed for age/gender General Gait Details: Verbal cues for safe use of RW, gait sequence, and to stand upright during gait.  On entering bathroom, there is a slight incline.  Patient required min assist to safely maneuver RW up and down the incline.   Stairs            Wheelchair Mobility    Modified Rankin (Stroke Patients Only)       Balance                                    Cognition Arousal/Alertness: Awake/alert Behavior During Therapy: WFL for tasks assessed/performed Overall Cognitive Status: History of cognitive impairments - at baseline Area of Impairment: Memory;Awareness;Problem solving;Safety/judgement     Memory: Decreased recall of precautions;Decreased short-term memory   Safety/Judgement: Decreased awareness of safety   Problem Solving: Slow processing;Difficulty sequencing;Requires verbal cues;Requires tactile cues General Comments: Patient laying in wet bed.  States she has not called anyone.    Exercises      General Comments        Pertinent Vitals/Pain Pain Assessment: 0-10 Pain Score: 8  Pain Location: Rt knee Pain Descriptors / Indicators: Aching;Sore Pain Intervention(s): Monitored during session;Repositioned;Patient requesting pain meds-RN notified    Home Living                      Prior Function            PT Goals (current goals can now be found in the care plan section) Progress towards PT goals:  Progressing toward goals    Frequency  7X/week    PT Plan Current plan remains appropriate    Co-evaluation             End of Session Equipment Utilized During Treatment: Gait belt;Right knee immobilizer Activity Tolerance: Patient tolerated treatment well Patient left: in chair;with call bell/phone within reach;with chair alarm set     Time: [id number] PT Time Calculation (min) (ACUTE ONLY): 24 min  Charges:  $Gait Training: 23-37 mins                    G Codes:      Vena AustriaDavis, Gina Hodgdon  H 12/23/2014, 7:03 PM Durenda HurtSusan H. Renaldo Parker, PT, [facility] Acute Rehab Services Pager [phone]

## 2014-12-23 NOTE — Progress Notes (Signed)
Notified Dr Herbie BaltimoreYates  X ray results, got order to cancel discharge, and  stated will inform Dr Magnus IvanBlackman about the results.

## 2014-12-23 NOTE — Progress Notes (Signed)
Occupational Therapy Treatment Patient Details Name: Gina Parker MRN: [id number] DOB: 1941-02-27 Today's Date: 12/23/2014    History of present illness Pt s/p R TKA 3/15. 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    OT comments  Patient progressing towards goals, continue plan of care for now. Patient with cognitive impairments at baseline which limit her ability to go home at this time. Continue to recommend SNF and 24/7 supervision/assistance.    Follow Up Recommendations  SNF;Supervision/Assistance - 24 hour    Equipment Recommendations  None recommended by OT    Recommendations for Other Services  None at this time    Precautions / Restrictions Precautions Precautions: Knee;Fall Precaution Comments: Reviewed importance of no pillow under knee and knee precautions, pt with poor carryover Required Braces or Orthoses: Knee Immobilizer - Right Knee Immobilizer - Right: On when out of bed or walking Restrictions Weight Bearing Restrictions: Yes RLE Weight Bearing: Weight bearing as tolerated       Mobility Bed Mobility General bed mobility comments: Patient found seated in recliner upon OT entering room  Transfers Overall transfer level: Needs assistance Equipment used: Rolling walker (2 wheeled) Transfers: Sit to/from Stand Sit to Stand: Min guard   General transfer comment: Cues for safety and technique    Balance Overall balance assessment: Needs assistance Sitting-balance support: No upper extremity supported;Feet supported Sitting balance-Leahy Scale: Fair     Standing balance support: Bilateral upper extremity supported;During functional activity Standing balance-Leahy Scale: Poor    ADL  General ADL Comments: Patient worked on reaching > BLEs to don/doff socks. With extra time and encouragement, patient able to reach BLEs for application of socks.      Cognition   Behavior During Therapy: WFL for tasks assessed/performed Overall  Cognitive Status: History of cognitive impairments - at baseline Area of Impairment: Memory;Awareness;Problem solving     Memory: Decreased recall of precautions;Decreased short-term memory  Following Commands: Follows one step commands with increased time   Awareness: Emergent                     Pertinent Vitals/ Pain       Pain Assessment: Faces Faces Pain Scale: Hurts even more Pain Location: Right knee during standing, patient did not rate pain Pain Descriptors / Indicators: Grimacing;Guarding Pain Intervention(s): Monitored during session;Repositioned         Frequency Min 2X/week     Progress Toward Goals  OT Goals(current goals can now be found in the care plan section)  Progress towards OT goals: Progressing toward goals     Plan Discharge plan remains appropriate       End of Session Equipment Utilized During Treatment: Rolling walker;Right knee immobilizer   Activity Tolerance Patient tolerated treatment well   Patient Left in chair;with call bell/phone within reach;with chair alarm set     Time: [id number] OT Time Calculation (min): 17 min  Charges: OT General Charges $OT Visit: 1 Procedure OT Treatments $Self Care/Home Management : 8-22 mins  Calvyn Kurtzman , MS, OTR/L, CLT Pager: [phone]  12/23/2014, 11:58 AM

## 2014-12-23 NOTE — Discharge Summary (Signed)
[name] MRN: [id number] DOB/AGE: Feb 27, 1941 74 y.o.  Admit date: 12/17/2014 Discharge date: 12/23/2014  Admission Diagnoses:  Principal Problem:   Osteoarthritis of right knee Active Problems:   Status post total right knee replacement   Discharge Diagnoses:  Same  Past Medical History  Diagnosis Date  . Hypertension     takes Amlodipine daily  . Hyperlipidemia     doesn't take any meds  . Shortness of breath dyspnea     with exertion  . History of bronchitis     many,many yrs ago  . Headache     couple of times a week  . Brain aneurysm     takes Keppra daily  . Confusion   . Weakness     tingling in both hand  . Carpal tunnel syndrome   . Arthritis   . Joint pain   . Joint swelling   . Back pain     arthritis  . GERD (gastroesophageal reflux disease)     takes pepcid daily  . Urinary frequency   . Nocturia   . Cataracts, bilateral     immautre  . Anxiety     takes Citalpram daily  . Insomnia   . PAF (paroxysmal atrial fibrillation)     03/2014 (Dr. Yates DecampJay Ganji); not felt to be a candidate for anti-coag at that time due to h/o intracranial hemorrhage '10    Surgeries: Procedure(s): RIGHT TOTAL KNEE ARTHROPLASTY on 12/17/2014   Consultants:    Discharged Condition: Improved  Hospital Course: Gina Parker is an 74 y.o. female who was admitted 12/17/2014 for operative treatment ofOsteoarthritis of right knee. Patient has severe unremitting pain that affects sleep, daily activities, and work/hobbies. After pre-op clearance the patient was taken to the operating room on 12/17/2014 and underwent  Procedure(s): RIGHT TOTAL KNEE ARTHROPLASTY.    Patient was given perioperative antibiotics: Anti-infectives    Start     Dose/Rate Route Frequency Ordered Stop   12/17/14 2100  ceFAZolin (ANCEF) IVPB 1 g/50 mL premix     1 g 100 mL/hr over 30 Minutes Intravenous Every 6 hours 12/17/14 1945 12/18/14 0444   12/17/14 0600  ceFAZolin (ANCEF) IVPB  2 g/50 mL premix     2 g 100 mL/hr over 30 Minutes Intravenous On call to O.R. 12/16/14 1246 12/17/14 1330       Patient was given sequential compression devices, early ambulation, and chemoprophylaxis to prevent DVT.  Patient benefited maximally from hospital stay and there were no complications.    Recent vital signs: Patient Vitals for the past 24 hrs:  BP Temp Temp src Pulse Resp SpO2  12/23/14 0625 112/63 mmHg 98.6 F (37 C) Oral 88 18 98 %  12/22/14 2251 110/73 mmHg 99 F (37.2 C) Oral 90 16 94 %     Recent laboratory studies:  Recent Labs  12/21/14 0349  WBC 6.7  HGB 10.3*  HCT 32.2*  PLT 196     Discharge Medications:     Medication List    STOP taking these medications        aspirin EC 81 MG tablet      TAKE these medications        amLODipine 10 MG tablet  Commonly known as:  NORVASC  Take 10 mg by mouth daily.     citalopram 20 MG tablet  Commonly known as:  CELEXA  Take 20 mg by mouth daily.     famotidine 40 MG tablet  Commonly known as:  PEPCID  Take 40 mg by mouth 4 (four) times daily.     levETIRAcetam 1000 MG tablet  Commonly known as:  KEPPRA  Take 1,000 mg by mouth 2 (two) times daily.     oxyCODONE-acetaminophen 5-325 MG per tablet  Commonly known as:  ROXICET  Take 1-2 tablets by mouth every 4 (four) hours as needed.     rivaroxaban 10 MG Tabs tablet  Commonly known as:  XARELTO  Take 1 tablet (10 mg total) by mouth daily with breakfast.        Diagnostic Studies: Ct Angio Chest Pe W/cm &/or Wo Cm  12/20/2014   ADDENDUM REPORT: 12/20/2014 12:05  ADDENDUM: The ascending thoracic aorta measures 4.0 x 3.8 cm. Recommend annual imaging followup by CTA or MRA. This recommendation follows 2010 ACCF/AHA/AATS/ACR/ASA/SCA/SCAI/SIR/STS/SVM Guidelines for the Diagnosis and Management of Patients with Thoracic Aortic Disease. Circulation. 2010; 121: Z610-R604   Electronically Signed   By: Bretta Bang III M.D.   On: 12/20/2014 12:05    12/20/2014   CLINICAL DATA:  Difficulty breathing. Right lower extremity deep venous thrombosis.  EXAM: CT ANGIOGRAPHY CHEST WITH CONTRAST  TECHNIQUE: Multidetector CT imaging of the chest was performed using the standard protocol during bolus administration of intravenous contrast. Multiplanar CT image reconstructions and MIPs were obtained to evaluate the vascular anatomy.  CONTRAST:  80mL OMNIPAQUE IOHEXOL 350 MG/ML SOLN  COMPARISON:  Chest radiograph July 29, 2009; chest CT July 19, 2008  FINDINGS: There is no demonstrable pulmonary embolus. There is slight prominence in the ascending thoracic aorta with a maximum transverse diameter of 4.0 x 3.8 cm. There is no evidence suggesting thoracic aortic dissection. There is coronary artery calcification in the left anterior descending coronary artery. Pericardium is not thickened.  There is mild bibasilar atelectatic change. There is a 4 mm nodular opacity in the medial segment of the right middle lobe, stable from prior study, seen on axial slice 39 series 6. There is a 4 mm nodular opacity in the anterior segment of the right lower lobe seen on slice 43 series 6 which appears marginally larger than on the previous study. There is a nodular opacity abutting the pleura in the medial segment of the right middle lobe measuring 5 mm, stable.  There is a nodular opacity abutting the mediastinum which is felt to reside within the posterior segment of the right upper lobe measuring 1.1 x 0.7 cm. It is possible that this structure represents an eccentric lymph node. This structure is best seen on axial slice 38 series 4.  Visualized thyroid appears normal.  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  There are no blastic or lytic bone lesions.  By size criteria, there is no adenopathy in the abdomen or pelvis.  Review of the MIP images confirms the above  findings.  IMPRESSION: No demonstrable pulmonary embolus.  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 This finding warrants either abdominal MR or abdominal CT pre and post-contrast to further assess.  Question pulmonary nodular lesion versus eccentric lymph node abutting the mediastinum in the region of the anterior segment right upper lobe. There are smaller pulmonary nodular lesions as well. Given this change abutting the pleural on the right, followup CT of the chest in 3 months advised to further evaluate.  No lung edema or consolidation.  No adenopathy by size criteria.  These results will  be called to the ordering clinician or representative by the Radiologist Assistant, and communication documented in the PACS or zVision Dashboard.  Electronically Signed: By: Bretta Bang III M.D. On: 12/20/2014 11:03   Dg Knee Right Port  12/17/2014   CLINICAL DATA:  Right total knee prosthesis placed today.  EXAM: PORTABLE RIGHT KNEE - 1-2 VIEW  COMPARISON:  None.  FINDINGS: Right total knee prosthesis in satisfactory position and alignment. No fracture or dislocation is seen. Associated soft tissue air.  IMPRESSION: Satisfactory postoperative appearance of a right total knee prosthesis.   Electronically Signed   By: Beckie Salts M.D.   On: 12/17/2014 16:59    Disposition: to skilled nursing facility      Discharge Instructions    Call MD / Call 911    Complete by:  As directed   If you experience chest pain or shortness of breath, CALL 911 and be transported to the hospital emergency room.  If you develope a fever above 101 F, pus (white drainage) or increased drainage or redness at the wound, or calf pain, call your surgeon's office.     Constipation Prevention    Complete by:  As directed   Drink plenty of fluids.  Prune juice may be helpful.  You may use a stool softener, such as Colace (over the counter) 100 mg twice a day.  Use MiraLax (over the counter) for constipation as needed.      Diet - low sodium heart healthy    Complete by:  As directed      Discharge instructions    Complete by:  As directed   Can put full wight on right leg; work on knee motion. Leave current dressing in place until outpatient follow-up. Can get dressing wet daily in the shower.     Discharge patient    Complete by:  As directed      Increase activity slowly as tolerated    Complete by:  As directed            Follow-up Information    Follow up with Kathryne Hitch, MD In 2 weeks.   Specialty:  Orthopedic Surgery   Contact information:   [address] [place] [address] [phone]       Follow up with Kathryne Hitch, MD In 1 week.   Specialty:  Orthopedic Surgery   Contact information:   [address] [place] [address] [phone]        Signed: Kathryne Hitch 12/23/2014, 7:43 AM

## 2014-12-23 NOTE — Clinical Social Work Note (Signed)
[facility] Anticipated discharge date:12/23/14 Family notified: Ma RingsLisa Transportation by Sharin MonsPTAR- scheduled for 4:30pm  CSW signing off.  Merlyn LotJenna Holoman, LCSWA Clinical Social Worker [phone]

## 2014-12-24 NOTE — Progress Notes (Signed)
[name], female   DOB: May 14, 1941, 74 y.o.   MRN: [id number] Patient can be discharged to skilled nursing today.  She had a minimal fall last evening and does not have an increase in knee pain at all.  He exam is also normal with no increased knee swelling.  The x-rays are reviewed and she does NOT have a new fracture.  A new dressing is placed on her knee.  Again, she can go today.

## 2014-12-24 NOTE — Progress Notes (Signed)
Report called to [facility]  

## 2014-12-24 NOTE — Progress Notes (Signed)
Physical Therapy Treatment Patient Details Name: Gina Parker MRN: [id number] DOB: 1941/05/19 Today's Date: 12/24/2014    History of Present Illness Pt s/p R TKA 3/15. 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     PT Comments    Pt with unwitnessed fall yesterday when she tried to get up by herself.  Educated on need for calling for A and chair alarm in place after PT.  Pt is scheduled to go to SNF today.  Follow Up Recommendations  SNF     Equipment Recommendations  None recommended by PT    Recommendations for Other Services       Precautions / Restrictions Precautions Precautions: Knee;Fall Required Braces or Orthoses: Knee Immobilizer - Right Knee Immobilizer - Right: On when out of bed or walking Restrictions RLE Weight Bearing: Weight bearing as tolerated    Mobility  Bed Mobility Overal bed mobility: Needs Assistance Bed Mobility: Supine to Sit     Supine to sit: Min assist     General bed mobility comments: A for R LE  Transfers Overall transfer level: Needs assistance Equipment used: Rolling walker (2 wheeled) Transfers: Sit to/from Stand Sit to Stand: Min assist         General transfer comment: A to power up and to steady self from elevated bed and BSC.  Ambulation/Gait Ambulation/Gait assistance: Min assist Ambulation Distance (Feet): 30 Feet Assistive device: Rolling walker (2 wheeled) Gait Pattern/deviations: Step-to pattern;Antalgic Gait velocity: decreased   General Gait Details: Pt with poor sequencing and need frequent reminders.  Pt with fall yesterday and appears more sore.   Stairs            Wheelchair Mobility    Modified Rankin (Stroke Patients Only)       Balance     Sitting balance-Leahy Scale: Fair     Standing balance support: Bilateral upper extremity supported Standing balance-Leahy Scale: Poor Standing balance comment: requires RW                    Cognition  Arousal/Alertness: Awake/alert Behavior During Therapy: WFL for tasks assessed/performed Overall Cognitive Status: History of cognitive impairments - at baseline Area of Impairment: Problem solving;Memory;Safety/judgement   Current Attention Level: Sustained Memory: Decreased recall of precautions;Decreased short-term memory Following Commands: Follows one step commands with increased time Safety/Judgement: Decreased awareness of safety Awareness: Emergent Problem Solving: Slow processing;Difficulty sequencing      Exercises Total Joint Exercises Ankle Circles/Pumps: AROM;Both;10 reps Quad Sets: AROM;Both;10 reps Heel Slides: AROM;Right;10 reps;Supine    General Comments        Pertinent Vitals/Pain Pain Assessment: 0-10 Pain Score: 7  Pain Location: R knee Pain Descriptors / Indicators: Aching Pain Intervention(s): Ice applied;Repositioned;Limited activity within patient's tolerance;Monitored during session    Home Living                      Prior Function            PT Goals (current goals can now be found in the care plan section) Acute Rehab PT Goals PT Goal Formulation: With patient Time For Goal Achievement: 12/25/14 Potential to Achieve Goals: Good Progress towards PT goals: Progressing toward goals    Frequency  7X/week    PT Plan Current plan remains appropriate    Co-evaluation             End of Session Equipment Utilized During Treatment: Gait belt;Right knee immobilizer Activity Tolerance: Patient tolerated  treatment well Patient left: in chair;with call bell/phone within reach;with chair alarm set     Time: [id number] PT Time Calculation (min) (ACUTE ONLY): 24 min  Charges:  $Gait Training: 8-22 mins $Therapeutic Exercise: 8-22 mins                    G Codes:      Zani Kyllonen LUBECK 12/24/2014, 11:24 AM

## 2014-12-24 NOTE — Discharge Planning (Signed)
[facility] Patient updated regarding discharge.  [facility] Report number: [phone] Transportation: EMS ([facility]AR)  Marcelline Deistmily Delorise Hunkele, LCSWA [phone]) Licensed Clinical Social Worker Orthopedics [phone]([id number]) and Surgical [phone](6N17-32)

## 2014-12-26 ENCOUNTER — Non-Acute Institutional Stay (SKILLED_NURSING_FACILITY): Payer: Medicare HMO | Admitting: Internal Medicine

## 2014-12-26 DIAGNOSIS — F32A Depression, unspecified: Secondary | ICD-10-CM

## 2014-12-26 DIAGNOSIS — D62 Acute posthemorrhagic anemia: Secondary | ICD-10-CM

## 2014-12-26 DIAGNOSIS — K219 Gastro-esophageal reflux disease without esophagitis: Secondary | ICD-10-CM

## 2014-12-26 DIAGNOSIS — F329 Major depressive disorder, single episode, unspecified: Secondary | ICD-10-CM

## 2014-12-26 DIAGNOSIS — I10 Essential (primary) hypertension: Secondary | ICD-10-CM | POA: Diagnosis not present

## 2014-12-26 DIAGNOSIS — M1711 Unilateral primary osteoarthritis, right knee: Secondary | ICD-10-CM | POA: Diagnosis not present

## 2014-12-26 DIAGNOSIS — K5900 Constipation, unspecified: Secondary | ICD-10-CM | POA: Diagnosis not present

## 2014-12-26 NOTE — Progress Notes (Signed)
Patient ID: Erskine SpeedBernice C Burger, female   DOB: Jun 03, 1941, 74 y.o.   MRN: [id number]     [facility]   PCP: Pcp Not In System  Code Status: full code  No Known Allergies  Chief Complaint  Patient presents with  . New Admit To SNF     HPI:  74 year old patient is here for short term rehabilitation post hospital admission from 12/17/14-12/23/14 with right knee OA. She underwent right total knee arthroplasty. She is seen in her room today. She complaints of pain in her knee area. She has not taken her pain medication this am denies any other concerns. She has PMH of HTN, DJD, osteoporosis  Review of Systems:  Constitutional: Negative for fever, chills, malaise/fatigue and diaphoresis.  HENT: Negative for headache, congestion Respiratory: Negative for cough, shortness of breath and wheezing.   Cardiovascular: Negative for chest pain, palpitations  Gastrointestinal: Negative for heartburn, nausea, vomiting, abdominal pain Genitourinary: Negative for dysuria and flank pain.  Musculoskeletal: Negative for back pain, falls Skin: Negative for itching, rash.  Neurological: Negative for dizziness, tingling, focal weakness Psychiatric/Behavioral: Negative for depression   Past Medical History  Diagnosis Date  . Hypertension     takes Amlodipine daily  . Hyperlipidemia     doesn't take any meds  . Shortness of breath dyspnea     with exertion  . History of bronchitis     many,many yrs ago  . Headache     couple of times a week  . Brain aneurysm     takes Keppra daily  . Confusion   . Weakness     tingling in both hand  . Carpal tunnel syndrome   . Arthritis   . Joint pain   . Joint swelling   . Back pain     arthritis  . GERD (gastroesophageal reflux disease)     takes pepcid daily  . Urinary frequency   . Nocturia   . Cataracts, bilateral     immautre  . Anxiety     takes Citalpram daily  . Insomnia   . PAF (paroxysmal atrial  fibrillation)     03/2014 (Dr. Yates DecampJay Ganji); not felt to be a candidate for anti-coag at that time due to h/o intracranial hemorrhage '10   Past Surgical History  Procedure Laterality Date  . Brain surgery  2007    aneurysm  . Joint replacement Left   . 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 Tracheostomy  04/2009  . Total knee arthroplasty Right 12/17/2014    Procedure: RIGHT TOTAL KNEE ARTHROPLASTY;  Surgeon: Kathryne Hitchhristopher Y Blackman, MD;  [facility];  Service: Orthopedics;  Laterality: Right;   Social History:   reports that she has quit smoking. She does not have any smokeless tobacco history on file. She reports that she does not drink alcohol or use illicit drugs.  No family history on file.  Medications: Patient's Medications  New Prescriptions   No medications on file  Previous Medications   AMLODIPINE (NORVASC) 10 MG TABLET    Take 10 mg by mouth daily.   CITALOPRAM (CELEXA) 20 MG TABLET    Take 20 mg by mouth daily.   FAMOTIDINE (PEPCID) 40 MG TABLET    Take 40 mg by mouth 4 (four) times daily.   LEVETIRACETAM (KEPPRA) 1000 MG TABLET    Take 1,000 mg by mouth 2 (two) times daily.   OXYCODONE-ACETAMINOPHEN (ROXICET) 5-325 MG PER TABLET    Take 1-2  tablets by mouth every 4 (four) hours as needed.   RIVAROXABAN (XARELTO) 10 MG TABS TABLET    Take 1 tablet (10 mg total) by mouth daily with breakfast.  Modified Medications   No medications on file  Discontinued Medications   No medications on file     Physical Exam: Filed Vitals:   12/26/14 1018  BP: 129/71  Pulse: 68  Temp: 98.7 F (37.1 C)  Resp: 16  SpO2: 99%    General- elderly female, in no acute distress Head- normocephalic, atraumatic Throat- moist mucus membrane Eyes- PERRLA, EOMI, no pallor, no icterus, no discharge Neck- no cervical lymphadenopathy Cardiovascular- normal s1,s2, no murmurs, right leg edema Respiratory- bilateral clear to auscultation, no wheeze, no rhonchi, no crackles, no use of  accessory muscles Abdomen- bowel sounds present, soft, non tender Musculoskeletal- limited right knee range of motion, generalized weakness present, immobilizer on right leg Neurological- no focal deficit Skin- warm and dry Psychiatry- alert and oriented, normal mood and affect    Labs reviewed: Basic Metabolic Panel:  Recent Labs  16/10/96 1043 12/18/14 0902 12/19/14 1910  NA 142 139 135  K 3.9 3.7 3.6  CL 106 105 99  CO2 GLUCOSE 103* 110* 119*  BUN CREATININE 0.82 0.77 0.84  CALCIUM 9.0 8.7 8.4   Liver Function Tests: No results for input(s): AST, ALT, ALKPHOS, BILITOT, PROT, ALBUMIN in the last 8760 hours. No results for input(s): LIPASE, AMYLASE in the last 8760 hours. No results for input(s): AMMONIA in the last 8760 hours. CBC:  Recent Labs  12/19/14 0716 12/20/14 0457 12/21/14 0349  WBC 6.2 6.9 6.7  HGB 10.4* 10.3* 10.3*  HCT 32.3* 32.2* 32.2*  MCV 89.0 88.7 89.2  PLT 160 173 196   Cardiac Enzymes:  Recent Labs  12/19/14 1910  TROPONINI <0.03    Assessment/Plan  Right knee OA S/p right knee arthroplasty. Will have her work with physical therapy and occupational therapy team to help with gait training and muscle strengthening exercises.fall precautions. Skin care. Encourage to be out of bed. Has f/u with Dr Magnus Ivan. Continue roxicet 5-325 1 tab 1-2 tab q4h prn pain and xarelto for dvt prophylaxis  Constipation Stable, continue miralax and colace  HTN bp stable, continue norvasc 10 mg daily  Anemia Likely post op from blood loss. Monitor cbc   Depression Continue celexa 20 mg daily  gerd Continue pepcid 40 mg four times daily home regimen   Goals of care: short term rehabilitation   Labs/tests ordered: cbc in 1 week  Family/ staff Communication: reviewed care plan with patient and nursing supervisor    Oneal Grout, MD  [facility] [phone] (Monday-Friday 8 am - 5 pm) [phone]  (afterhours)

## 2015-01-10 ENCOUNTER — Non-Acute Institutional Stay (SKILLED_NURSING_FACILITY): Payer: Medicare HMO | Admitting: Adult Health

## 2015-01-10 ENCOUNTER — Encounter: Payer: Self-pay | Admitting: Adult Health

## 2015-01-10 DIAGNOSIS — K219 Gastro-esophageal reflux disease without esophagitis: Secondary | ICD-10-CM | POA: Diagnosis not present

## 2015-01-10 DIAGNOSIS — D62 Acute posthemorrhagic anemia: Secondary | ICD-10-CM

## 2015-01-10 DIAGNOSIS — F329 Major depressive disorder, single episode, unspecified: Secondary | ICD-10-CM | POA: Diagnosis not present

## 2015-01-10 DIAGNOSIS — K5900 Constipation, unspecified: Secondary | ICD-10-CM

## 2015-01-10 DIAGNOSIS — F32A Depression, unspecified: Secondary | ICD-10-CM

## 2015-01-10 DIAGNOSIS — M1711 Unilateral primary osteoarthritis, right knee: Secondary | ICD-10-CM

## 2015-01-10 DIAGNOSIS — I10 Essential (primary) hypertension: Secondary | ICD-10-CM

## 2015-01-10 NOTE — Progress Notes (Addendum)
[name], female   DOB: 02-May-1941, 74 y.o.   MRN: [id number]   01/10/2015  Facility:  Nursing Home Location:  [facility] Room Number: 1104-1 LEVEL OF CARE:  SNF (31)   Chief Complaint  Patient presents with  . Discharge Note    Osteoporosis S/P right total knee arthroplasty, constipation, hypertension, anemia, depression and GERD    HISTORY OF PRESENT ILLNESS:  This is a 74 year old female who is for discharge home with home health PT for ambulation, balance and safety, OT for self care skills and ST for cognition. DME: 3 in one bedside commode. She has been admitted to [facility] on 12/24/14 from [facility] with osteoarthritis S/P right total knee arthroplasty.  Patient was admitted to this facility for short-term rehabilitation after the patient's recent hospitalization.  Patient has completed SNF rehabilitation and therapy has cleared the patient for discharge.  PAST MEDICAL HISTORY:  Past Medical History  Diagnosis Date  . Hypertension     takes Amlodipine daily  . Hyperlipidemia     doesn't take any meds  . Shortness of breath dyspnea     with exertion  . History of bronchitis     many,many yrs ago  . Headache     couple of times a week  . Brain aneurysm     takes Keppra daily  . Confusion   . Weakness     tingling in both hand  . Carpal tunnel syndrome   . Arthritis   . Joint pain   . Joint swelling   . Back pain     arthritis  . GERD (gastroesophageal reflux disease)     takes pepcid daily  . Urinary frequency   . Nocturia   . Cataracts, bilateral     immautre  . Anxiety     takes Citalpram daily  . Insomnia   . PAF (paroxysmal atrial fibrillation)     03/2014 (Dr. Yates Decamp); not felt to be a candidate for anti-coag at that time due to h/o intracranial hemorrhage '10    CURRENT MEDICATIONS: Reviewed per MAR/see medication list  No Known Allergies   REVIEW OF SYSTEMS:  GENERAL: no change  in appetite, no fatigue, no weight changes, no fever, chills or weakness RESPIRATORY: no cough, SOB, DOE, wheezing, hemoptysis CARDIAC: no chest pain, or palpitations GI: no abdominal pain, diarrhea, constipation, heart burn, nausea or vomiting  PHYSICAL EXAMINATION  GENERAL: no acute distress SKIN:  Right knee surgical wound has Steri-Strips; healed EYES: conjunctivae normal, sclerae normal, normal eye lids NECK: supple, trachea midline, no neck masses, no thyroid tenderness, no thyromegaly LYMPHATICS: no LAN in the neck, no supraclavicular LAN RESPIRATORY: breathing is even & unlabored, BS CTAB CARDIAC: RRR, no murmur,no extra heart sounds, BLE edema 2+ GI: abdomen soft, normal BS, no masses, no tenderness, no hepatomegaly, no splenomegaly EXTREMITIES: Able to move 4 extremities PSYCHIATRIC: the patient is alert & oriented to person, affect & behavior appropriate  LABS/RADIOLOGY: 01/03/15  right lower extremity venous ultrasound is negative for DVT 01/02/15  WBC 5.6 hemoglobin 11.8 hematocrit 35.8 MCV 85.9 Labs reviewed: Basic Metabolic Panel:  Recent Labs  40/98/11 1043 12/18/14 0902 12/19/14 1910  NA 142 139 135  K 3.9 3.7 3.6  CL 106 105 99  CO2 GLUCOSE 103* 110* 119*  BUN CREATININE 0.82 0.77 0.84  CALCIUM 9.0 8.7 8.4   CBC:  Recent Labs  12/19/14 0716 12/20/14 0457 12/21/14 0349  WBC 6.2 6.9 6.7  HGB 10.4* 10.3* 10.3*  HCT 32.3* 32.2* 32.2*  MCV 89.0 88.7 89.2  PLT 160 173 196   Cardiac Enzymes:  Recent Labs  12/19/14 1910  TROPONINI <0.03     Dg Knee 1-2 Views Right  12/23/2014   CLINICAL DATA:  Larey SeatFell yesterday, knee replacement 5 days ago  EXAM: RIGHT KNEE - 1-2 VIEW  COMPARISON:  None.  FINDINGS: The right knee demonstrates a total knee arthroplasty. There is relative lucency along the medial femoral condyle peripherally adjacent to the arthroplasty concerning for a nondisplaced fracture. There is no significant joint effusion.  There is no fracture or dislocation. The alignment is anatomic.  IMPRESSION: Right total knee arthroplasty with relative lucency along the medial femoral condyle peripherally adjacent to the arthroplasty concerning for a nondisplaced fracture.   Electronically Signed   By: Elige KoHetal  Patel   On: 12/23/2014 20:30   Ct Angio Chest Pe W/cm &/or Wo Cm  12/20/2014   ADDENDUM REPORT: 12/20/2014 12:05  ADDENDUM: The ascending thoracic aorta measures 4.0 x 3.8 cm. Recommend annual imaging followup by CTA or MRA. This recommendation follows 2010 ACCF/AHA/AATS/ACR/ASA/SCA/SCAI/SIR/STS/SVM Guidelines for the Diagnosis and Management of Patients with Thoracic Aortic Disease. Circulation. 2010; 121: N829-F621: [id number]   Electronically Signed   By: Bretta BangWilliam  Woodruff III M.D.   On: 12/20/2014 12:05   12/20/2014   CLINICAL DATA:  Difficulty breathing. Right lower extremity deep venous thrombosis.  EXAM: CT ANGIOGRAPHY CHEST WITH CONTRAST  TECHNIQUE: Multidetector CT imaging of the chest was performed using the standard protocol during bolus administration of intravenous contrast. Multiplanar CT image reconstructions and MIPs were obtained to evaluate the vascular anatomy.  CONTRAST:  80mL OMNIPAQUE IOHEXOL 350 MG/ML SOLN  COMPARISON:  Chest radiograph July 29, 2009; chest CT July 19, 2008  FINDINGS: There is no demonstrable pulmonary embolus. There is slight prominence in the ascending thoracic aorta with a maximum transverse diameter of 4.0 x 3.8 cm. There is no evidence suggesting thoracic aortic dissection. There is coronary artery calcification in the left anterior descending coronary artery. Pericardium is not thickened.  There is mild bibasilar atelectatic change. There is a 4 mm nodular opacity in the medial segment of the right middle lobe, stable from prior study, seen on axial slice 39 series 6. There is a 4 mm nodular opacity in the anterior segment of the right lower lobe seen on slice 43 series 6 which appears marginally  larger than on the previous study. There is a nodular opacity abutting the pleura in the medial segment of the right middle lobe measuring 5 mm, stable.  There is a nodular opacity abutting the mediastinum which is felt to reside within the posterior segment of the right upper lobe measuring 1.1 x 0.7 cm. It is possible that this structure represents an eccentric lymph node. This structure is best seen on axial slice 38 series 4.  Visualized thyroid appears normal.  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  There are no blastic or lytic bone lesions.  By size criteria, there is no adenopathy in the abdomen or pelvis.  Review of the MIP images confirms the above findings.  IMPRESSION: No demonstrable pulmonary embolus.  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 This finding warrants either abdominal MR or abdominal CT pre and post-contrast to further assess.  Question pulmonary nodular lesion  versus eccentric lymph node abutting the mediastinum in the region of the anterior segment right upper lobe. There are smaller pulmonary nodular lesions as well. Given this change abutting the pleural on the right, followup CT of the chest in 3 months advised to further evaluate.  No lung edema or consolidation.  No adenopathy by size criteria.  These results will be called to the ordering clinician or representative by the Radiologist Assistant, and communication documented in the PACS or zVision Dashboard.  Electronically Signed: By: Bretta Bang III M.D. On: 12/20/2014 11:03   Dg Knee Right Port  12/17/2014   CLINICAL DATA:  Right total knee prosthesis placed today.  EXAM: PORTABLE RIGHT KNEE - 1-2 VIEW  COMPARISON:  None.  FINDINGS: Right total knee prosthesis in satisfactory position and alignment. No fracture or dislocation is seen. Associated soft tissue air.  IMPRESSION: Satisfactory  postoperative appearance of a right total knee prosthesis.   Electronically Signed   By: Beckie Salts M.D.   On: 12/17/2014 16:59    ASSESSMENT/PLAN:  Osteoarthritis S/P right total knee arthroplasty - for home health PT, OT and ST; continue Percocet 5/325 mg 1-2 tabs by mouth every 4 hours when necessary for pain Constipation - continue Colace and MiraLAX when necessary Hypertension - well controlled; continue Norvasc 10 mg 1 tab by mouth daily Anemia, acute blood loss - hemoglobin 11.8; stable Depression - mood is stable; continue Celexa 20 mg by mouth daily GERD - continue Pepcid 40 mg 1 tab by mouth daily   I have filled out patient's discharge paperwork and written prescriptions.  Patient will receive home health PT, OT and ST.  DME provided:  3 in one bedside commode  Total discharge time: Greater than 30 minutes  Discharge time involved coordination of the discharge process with social worker, nursing staff and therapy department. Medical justification for home health services/DME verified.   [facility], NP [employer] [phone]

## 2015-01-15 DIAGNOSIS — Z96651 Presence of right artificial knee joint: Secondary | ICD-10-CM | POA: Diagnosis not present

## 2015-01-15 DIAGNOSIS — R4189 Other symptoms and signs involving cognitive functions and awareness: Secondary | ICD-10-CM | POA: Diagnosis not present

## 2015-01-15 DIAGNOSIS — Z471 Aftercare following joint replacement surgery: Secondary | ICD-10-CM | POA: Diagnosis not present

## 2015-01-15 DIAGNOSIS — R531 Weakness: Secondary | ICD-10-CM | POA: Diagnosis not present

## 2015-02-08 ENCOUNTER — Other Ambulatory Visit: Payer: Self-pay | Admitting: Nurse Practitioner

## 2015-03-06 ENCOUNTER — Ambulatory Visit: Payer: Medicare HMO | Attending: Orthopaedic Surgery | Admitting: Physical Therapy

## 2015-03-06 DIAGNOSIS — M25561 Pain in right knee: Secondary | ICD-10-CM | POA: Diagnosis present

## 2015-03-06 DIAGNOSIS — R269 Unspecified abnormalities of gait and mobility: Secondary | ICD-10-CM | POA: Diagnosis not present

## 2015-03-06 DIAGNOSIS — M25661 Stiffness of right knee, not elsewhere classified: Secondary | ICD-10-CM | POA: Diagnosis not present

## 2015-03-06 DIAGNOSIS — R29898 Other symptoms and signs involving the musculoskeletal system: Secondary | ICD-10-CM | POA: Insufficient documentation

## 2015-03-06 NOTE — Therapy (Signed)
[facility] [address] [place], [place], [address] Phone: [phone]   Fax:  [phone]  Physical Therapy Evaluation  Patient Details  Name: Gina SpeedBernice C Alban MRN: [id number] Date of Birth: [birth year] Referring Provider:  Kathryne HitchBlackman, Christopher Y*  Encounter Date: 03/06/2015      PT End of Session - 03/06/15 1311    Visit Number 1   Number of Visits 12   Date for PT Re-Evaluation 04/17/15   PT Start Time 1145   PT Stop Time 1230   PT Time Calculation (min) 45 min   Activity Tolerance Patient tolerated treatment well   Behavior During Therapy [facility] for tasks assessed/performed      Past Medical History  Diagnosis Date  . Hypertension     takes Amlodipine daily  . Hyperlipidemia     doesn't take any meds  . Shortness of breath dyspnea     with exertion  . History of bronchitis     many,many yrs ago  . Headache     couple of times a week  . Brain aneurysm     takes Keppra daily  . Confusion   . Weakness     tingling in both hand  . Carpal tunnel syndrome   . Arthritis   . Joint pain   . Joint swelling   . Back pain     arthritis  . GERD (gastroesophageal reflux disease)     takes pepcid daily  . Urinary frequency   . Nocturia   . Cataracts, bilateral     immautre  . Anxiety     takes Citalpram daily  . Insomnia   . PAF (paroxysmal atrial fibrillation)     03/2014 (Dr. Yates DecampJay Ganji); not felt to be a candidate for anti-coag at that time due to h/o intracranial hemorrhage '10    Past Surgical History  Procedure Laterality Date  . Brain surgery  2007    aneurysm  . Joint replacement Left   . 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 Tracheostomy  04/2009  . Total knee arthroplasty Right 12/17/2014    Procedure: RIGHT TOTAL KNEE ARTHROPLASTY;  Surgeon: Kathryne Hitchhristopher Y Blackman, MD;  [facility];  Service: Orthopedics;  Laterality: Right;    There were no vitals filed for this visit.  Visit Diagnosis:   Right knee pain - Plan: PT plan of care cert/re-cert  Decreased ROM of right knee - Plan: PT plan of care cert/re-cert  Abnormality of gait - Plan: PT plan of care cert/re-cert  Stiffness of right knee - Plan: PT plan of care cert/re-cert      Subjective Assessment - 03/06/15 1156    Subjective pt is a 74 y.o F with CC of R knee pain s/P R TKA 12/17/14. She reports that the pain is better since onset.    Limitations Sitting;Lifting;Standing;Walking;House hold activities   How long can you sit comfortably? unlimited   How long can you stand comfortably? 15 - 20 min   How long can you walk comfortably? 15-20 min   Diagnostic tests 12/23/2014 reported all looked good.    Patient Stated Goals to get faster, pain free, to be able to walk as fast as usual.   Currently in Pain? Yes   Pain Score 7   last took pain medication yesterday.   Pain Location Knee   Pain Orientation Right   Pain Descriptors / Indicators Aching   Pain Type Surgical pain   Pain Onset More than a month  ago   Pain Frequency Intermittent   Aggravating Factors  weather depends on activity intemittent   Pain Relieving Factors pain medication, exercise            [facility] PT Assessment - 03/06/15 1202    Assessment   Medical Diagnosis R TKA   Onset Date/Surgical Date 12/17/14   Hand Dominance Right   Next MD Visit --  July/2016   Prior Therapy yes  for L TKA 10 years ago   Precautions   Precautions None   Restrictions   Weight Bearing Restrictions No   Balance Screen   Has the patient fallen in the past 6 months No   Has the patient had a decrease in activity level because of a fear of falling?  No   Is the patient reluctant to leave their home because of a fear of falling?  No   Home Environment   Living Environment Private residence   Living Arrangements Children   Available Help at Discharge Available PRN/intermittently;Available 24 hours/day   Type of Home House   Home Access Stairs to enter    Entrance Stairs-Number of Steps 5   Entrance Stairs-Rails Can reach both   Home Layout One level   Home Equipment Talent - single point;Walker - 2 wheels   Prior Function   Level of Independence Independent;Independent with basic ADLs;Independent with household mobility with device;Independent with community mobility with device;Independent with homemaking with ambulation;Independent with gait;Independent with transfers   Cognition   Overall Cognitive Status Within Functional Limits for tasks assessed   Observation/Other Assessments   Focus on Therapeutic Outcomes (FOTO)  65% limitation   predicted 45%   Posture/Postural Control   Posture/Postural Control Postural limitations   Postural Limitations Rounded Shoulders;Forward head   ROM / Strength   AROM / PROM / Strength AROM;PROM;Strength   AROM   AROM Assessment Site Knee   Right/Left Knee Right;Left   Right Knee Extension -13   Right Knee Flexion 105   Left Knee Extension 0   Left Knee Flexion 112   PROM   PROM Assessment Site Knee   Right/Left Knee Right;Left   Left Knee Extension -9  R knee   Left Knee Flexion 107  R knee   Strength   Strength Assessment Site Knee   Right/Left Knee Right;Left   Right Knee Flexion 4+/5   Right Knee Extension 4+/5   Left Knee Flexion 4+/5   Left Knee Extension 4+/5   Palpation   Palpation comment tenderness located at the medial and lateral joint line and ant patella area                           PT Education - 03/06/15 1310    Education provided Yes   Education Details evaluation findings, POC, HEP, goals   Person(s) Educated Patient   Methods Explanation   Comprehension Verbalized understanding          PT Short Term Goals - 03/06/15 1317    PT SHORT TERM GOAL #1   Title She will be I with basic HEP (03/27/2015)   Time 3   Period Weeks   Status New   PT SHORT TERM GOAL #2   Title She will increase her FOTO score by > 8 points to assist with increased  functional capacity (03/27/2015)   Time 3   Period Weeks   Status New   PT SHORT TERM GOAL #3   Title She  will be able to verbalize and demonstrate techniques to reduce R knee inflammation via RICE method (03/27/2015)   Time 3   Period Weeks   Status New           PT Long Term Goals - 04-04-2015 1319    PT LONG TERM GOAL #1   Title pt will be I with advanced HEP given last visit (04/17/2015)   Time 6   Period Weeks   Status New   PT LONG TERM GOAL #2   Title She will increase R knee AROM to > 120 degrees to assist with funcitonal and efficent gait (04/17/2015)   Time 6   Period Weeks   Status New   PT LONG TERM GOAL #3   Title She will decrease her 10 MWT by > 4 seconds to assist with funcitonal and safe community ambulation with LRAD (04/17/2015)   Baseline 17 seconds   Time 6   Status New   PT LONG TERM GOAL #4   Title She will demonstrate < 2/10 pain during weight bearing activities for > 30  minutes and ascend/descend > 10 steps to assist with community ambulation and grocery shopping/ ADLs(04/17/2015   Time 6   Period Weeks   Status New   PT LONG TERM GOAL #5   Title She will increase her FOTO score to > 50 points to help with increased functional capacity upon discharge (04/17/2015)   Time 6   Period Weeks   Status New               Plan - Apr 04, 2015 1311    Clinical Impression Statement Leanna presents to OPPT with CC of R knee pain s/p R TKA on 12/17/2014. She has been partaking in a HHPT and has recently been released to OPPT. She demonstrates limited R knee AROM of -10 to 105 with pain noted at endrange. Strength is [facility] upon MMT. She currently ambulates with a SPC with a step to antagic gait pattern with limited step length bil, 10 MWT she scored .58 M/s which is well below funcitonal/safe community ambulation. She would benefit from skilled physical therapy to maximze her function by addressing the impairments listed.    Pt will benefit from skilled therapeutic  intervention in order to improve on the following deficits Abnormal gait;Decreased balance;Decreased activity tolerance;Decreased endurance;Decreased mobility;Decreased range of motion;Decreased strength;Increased edema;Difficulty walking;Hypomobility;Increased muscle spasms;Impaired flexibility;Pain   Rehab Potential Good   PT Frequency 2x / week   PT Duration 6 weeks   PT Next Visit Plan assess response to HEP, knee mobility, strengthening, knee girth assessment, modalities PRN   PT Home Exercise Plan HHPT with HEP handout   Consulted and Agree with Plan of Care Patient;Family member/caregiver   Family Member Consulted daughter          G-Codes - 04-Apr-2015 1326    Functional Limitation Mobility: Walking and moving around   Mobility: Walking and Moving Around Current Status [phone]) At least 60 percent but less than 80 percent impaired, limited or restricted   Mobility: Walking and Moving Around Goal Status [phone]) At least 40 percent but less than 60 percent impaired, limited or restricted       Problem List Patient Active Problem List   Diagnosis Date Noted  . Osteoarthritis of right knee 12/17/2014  . Status post total right knee replacement 12/17/2014  . POSITIVE TB SKIN TEST, WITHOUT TUBERCULOSIS 05/23/2009  . HYPERLIPIDEMIA 05/12/2009  . ANEMIA 05/12/2009  . HYPERTENSION 05/12/2009  . INTRACEREBRAL  HEMORRHAGE 05/12/2009  . CVA WITH RIGHT HEMIPARESIS 05/12/2009  . DEGENERATIVE JOINT DISEASE 05/12/2009  . OSTEOPOROSIS 05/12/2009   Lulu Riding PT, DPT, LAT, ATC  03/06/2015  1:29 PM    [facility] [address] [place], [place], [address] Phone: [phone]   Fax:  [phone]

## 2015-03-06 NOTE — Patient Instructions (Signed)
   Rui Wordell PT, DPT, LAT, ATC  [facility] Phone: [phone]     

## 2015-03-10 ENCOUNTER — Ambulatory Visit: Payer: Medicare HMO | Admitting: Physical Therapy

## 2015-03-10 DIAGNOSIS — R269 Unspecified abnormalities of gait and mobility: Secondary | ICD-10-CM

## 2015-03-10 DIAGNOSIS — M25561 Pain in right knee: Secondary | ICD-10-CM

## 2015-03-10 DIAGNOSIS — M25661 Stiffness of right knee, not elsewhere classified: Secondary | ICD-10-CM

## 2015-03-10 NOTE — Therapy (Signed)
[facility] [address] [place], [place], [address] Phone: [phone]   Fax:  [phone]  Physical Therapy Treatment  Patient Details  Name: Gina Parker MRN: [id number] Date of Birth: Aug 13, 1941 Referring Provider:  Kathryne HitchBlackman, Christopher Y*  Encounter Date: 03/10/2015      PT End of Session - 03/10/15 1602    Visit Number 2   Number of Visits 12   Date for PT Re-Evaluation 04/17/15   PT Start Time 0347   PT Stop Time 0440   PT Time Calculation (min) 53 min      Past Medical History  Diagnosis Date  . Hypertension     takes Amlodipine daily  . Hyperlipidemia     doesn't take any meds  . Shortness of breath dyspnea     with exertion  . History of bronchitis     many,many yrs ago  . Headache     couple of times a week  . Brain aneurysm     takes Keppra daily  . Confusion   . Weakness     tingling in both hand  . Carpal tunnel syndrome   . Arthritis   . Joint pain   . Joint swelling   . Back pain     arthritis  . GERD (gastroesophageal reflux disease)     takes pepcid daily  . Urinary frequency   . Nocturia   . Cataracts, bilateral     immautre  . Anxiety     takes Citalpram daily  . Insomnia   . PAF (paroxysmal atrial fibrillation)     03/2014 (Dr. Yates DecampJay Ganji); not felt to be a candidate for anti-coag at that time due to h/o intracranial hemorrhage '10    Past Surgical History  Procedure Laterality Date  . Brain surgery  2007    aneurysm  . Joint replacement Left   . 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 Tracheostomy  04/2009  . Total knee arthroplasty Right 12/17/2014    Procedure: RIGHT TOTAL KNEE ARTHROPLASTY;  Surgeon: Kathryne Hitchhristopher Y Blackman, MD;  [facility];  Service: Orthopedics;  Laterality: Right;    There were no vitals filed for this visit.  Visit Diagnosis:  Right knee pain  Decreased ROM of right knee  Abnormality of gait  Stiffness of right knee      Subjective  Assessment - 03/10/15 1607    Currently in Pain? Yes   Pain Score 8    Pain Location Knee   Pain Orientation Right;Anterior   Pain Descriptors / Indicators Aching   Aggravating Factors  walking alot                         [facility] Adult PT Treatment/Exercise - 03/10/15 1617    Knee/Hip Exercises: Aerobic   Stationary Bike Nustep L3 x 5 min LE only   Knee/Hip Exercises: Standing   SLS with Vectors march and hip extension x 10 bilateral for hip strength and proprioception   Other Standing Knee Exercises sit-stand x 15 without UE    Knee/Hip Exercises: Seated   Long Arc Quad Right;15 reps   Other Seated Knee Exercises seated hamstring curls with red band x 15   Knee/Hip Exercises: Supine   Quad Sets Right;10 reps   Short Arc Quad Sets 10 reps   Straight Leg Raises 10 reps   Modalities   Modalities Cryotherapy   Cryotherapy   Number Minutes Cryotherapy 15 Minutes  Cryotherapy Location Knee   Type of Cryotherapy Ice pack                  PT Short Term Goals - 03/06/15 1317    PT SHORT TERM GOAL #1   Title She will be I with basic HEP (03/27/2015)   Time 3   Period Weeks   Status New   PT SHORT TERM GOAL #2   Title She will increase her FOTO score by > 8 points to assist with increased functional capacity (03/27/2015)   Time 3   Period Weeks   Status New   PT SHORT TERM GOAL #3   Title She will be able to verbalize and demonstrate techniques to reduce R knee inflammation via RICE method (03/27/2015)   Time 3   Period Weeks   Status New           PT Long Term Goals - 03/06/15 1319    PT LONG TERM GOAL #1   Title pt will be I with advanced HEP given last visit (04/17/2015)   Time 6   Period Weeks   Status New   PT LONG TERM GOAL #2   Title She will increase R knee AROM to > 120 degrees to assist with funcitonal and efficent gait (04/17/2015)   Time 6   Period Weeks   Status New   PT LONG TERM GOAL #3   Title She will decrease her 10 MWT by  > 4 seconds to assist with funcitonal and safe community ambulation with LRAD (04/17/2015)   Baseline 17 seconds   Time 6   Status New   PT LONG TERM GOAL #4   Title She will demonstrate < 2/10 pain during weight bearing activities for > 30  minutes and ascend/descend > 10 steps to assist with community ambulation and grocery shopping/ ADLs(04/17/2015   Time 6   Period Weeks   Status New   PT LONG TERM GOAL #5   Title She will increase her FOTO score to > 50 points to help with increased functional capacity upon discharge (04/17/2015)   Time 6   Period Weeks   Status New               Plan - 03/10/15 1608    Clinical Impression Statement Pt instructed in LE strengthening exercises without c/o increased pain. Trial of using cane in left hand but pt feels more comfortable with cane in right hand.    PT Next Visit Plan assess response to HEP, knee mobility, strengthening, knee girth assessment, modalities PRN        Problem List Patient Active Problem List   Diagnosis Date Noted  . Osteoarthritis of right knee 12/17/2014  . Status post total right knee replacement 12/17/2014  . POSITIVE TB SKIN TEST, WITHOUT TUBERCULOSIS 05/23/2009  . HYPERLIPIDEMIA 05/12/2009  . ANEMIA 05/12/2009  . HYPERTENSION 05/12/2009  . INTRACEREBRAL HEMORRHAGE 05/12/2009  . CVA WITH RIGHT HEMIPARESIS 05/12/2009  . DEGENERATIVE JOINT DISEASE 05/12/2009  . OSTEOPOROSIS 05/12/2009    Sherrie Mustache , PTA  03/10/2015, 4:40 PM  [facility] [address] [place], [place], [address] Phone: [phone]   Fax:  [phone]

## 2015-03-12 ENCOUNTER — Ambulatory Visit: Payer: Medicare HMO | Admitting: Physical Therapy

## 2015-03-12 DIAGNOSIS — R269 Unspecified abnormalities of gait and mobility: Secondary | ICD-10-CM

## 2015-03-12 DIAGNOSIS — M25561 Pain in right knee: Secondary | ICD-10-CM

## 2015-03-12 DIAGNOSIS — M25661 Stiffness of right knee, not elsewhere classified: Secondary | ICD-10-CM

## 2015-03-12 NOTE — Therapy (Signed)
[facility] [address] [place], [place], [address] Phone: [phone]   Fax:  [phone]  Physical Therapy Treatment  Patient Details  Name: Gina Parker MRN: [id number] Date of Birth: Jan 03, 1941 Referring Provider:  Kathryne Hitch*  Encounter Date: 03/12/2015      PT End of Session - 03/12/15 1623    Visit Number 3   Number of Visits 12   Date for PT Re-Evaluation 04/17/15   PT Start Time 0345   PT Stop Time 0431   PT Time Calculation (min) 46 min      Past Medical History  Diagnosis Date  . Hypertension     takes Amlodipine daily  . Hyperlipidemia     doesn't take any meds  . Shortness of breath dyspnea     with exertion  . History of bronchitis     many,many yrs ago  . Headache     couple of times a week  . Brain aneurysm     takes Keppra daily  . Confusion   . Weakness     tingling in both hand  . Carpal tunnel syndrome   . Arthritis   . Joint pain   . Joint swelling   . Back pain     arthritis  . GERD (gastroesophageal reflux disease)     takes pepcid daily  . Urinary frequency   . Nocturia   . Cataracts, bilateral     immautre  . Anxiety     takes Citalpram daily  . Insomnia   . PAF (paroxysmal atrial fibrillation)     03/2014 (Dr. Yates Decamp); not felt to be a candidate for anti-coag at that time due to h/o intracranial hemorrhage '10    Past Surgical History  Procedure Laterality Date  . Brain surgery  2007    aneurysm  . Joint replacement Left   . 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 Tracheostomy  04/2009  . Total knee arthroplasty Right 12/17/2014    Procedure: RIGHT TOTAL KNEE ARTHROPLASTY;  Surgeon: Kathryne Hitch, MD;  Location: [facility];  Service: Orthopedics;  Laterality: Right;    There were no vitals filed for this visit.  Visit Diagnosis:  Right knee pain  Decreased ROM of right knee  Abnormality of gait  Stiffness of right  knee                       [facility] Adult PT Treatment/Exercise - 03/12/15 1555    Standardized Balance Assessment   Standardized Balance Assessment Berg Balance Test   Berg Balance Test   Sit to Stand Able to stand without using hands and stabilize independently   Standing Unsupported Able to stand safely 2 minutes   Sitting with Back Unsupported but Feet Supported on Floor or Stool Able to sit safely and securely 2 minutes   Stand to Sit Sits safely with minimal use of hands   Transfers Able to transfer safely, minor use of hands   Standing Unsupported with Eyes Closed Able to stand 10 seconds with supervision   Standing Ubsupported with Feet Together Able to place feet together independently and stand for 1 minute with supervision   From Standing, Reach Forward with Outstretched Arm Can reach forward >12 cm safely (5")   From Standing Position, Pick up Object from Floor Able to pick up shoe safely and easily   From Standing Position, Turn to Look Behind Over each Shoulder Looks behind from both  sides and weight shifts well   Turn 360 Degrees Able to turn 360 degrees safely one side only in 4 seconds or less   Standing Unsupported, Alternately Place Feet on Step/Stool Able to complete >2 steps/needs minimal assist   Standing Unsupported, One Foot in Front Able to take small step independently and hold 30 seconds   Standing on One Leg Able to lift leg independently and hold equal to or more than 3 seconds   Total Score 45   Knee/Hip Exercises: Stretches   Active Hamstring Stretch 3 reps;30 seconds  also seated 2 x 30 sec   Active Hamstring Stretch Limitations with sheet   Knee/Hip Exercises: Aerobic   Stationary Bike Nustep L3 x 6 min LE only   Knee/Hip Exercises: Supine   Heel Slides Limitations 3 reps 30 sec onds with sheet   Manual Therapy   Manual Therapy Joint mobilization;Passive ROM   Joint Mobilization Patella mobs all planes, knee flexion and extension mobs in  supine   Passive ROM flexion and extension in supine                  PT Short Term Goals - 03/06/15 1317    PT SHORT TERM GOAL #1   Title She will be I with basic HEP (03/27/2015)   Time 3   Period Weeks   Status New   PT SHORT TERM GOAL #2   Title She will increase her FOTO score by > 8 points to assist with increased functional capacity (03/27/2015)   Time 3   Period Weeks   Status New   PT SHORT TERM GOAL #3   Title She will be able to verbalize and demonstrate techniques to reduce R knee inflammation via RICE method (03/27/2015)   Time 3   Period Weeks   Status New           PT Long Term Goals - 03/06/15 1319    PT LONG TERM GOAL #1   Title pt will be I with advanced HEP given last visit (04/17/2015)   Time 6   Period Weeks   Status New   PT LONG TERM GOAL #2   Title She will increase R knee AROM to > 120 degrees to assist with funcitonal and efficent gait (04/17/2015)   Time 6   Period Weeks   Status New   PT LONG TERM GOAL #3   Title She will decrease her 10 MWT by > 4 seconds to assist with funcitonal and safe community ambulation with LRAD (04/17/2015)   Baseline 17 seconds   Time 6   Status New   PT LONG TERM GOAL #4   Title She will demonstrate < 2/10 pain during weight bearing activities for > 30  minutes and ascend/descend > 10 steps to assist with community ambulation and grocery shopping/ ADLs(04/17/2015   Time 6   Period Weeks   Status New   PT LONG TERM GOAL #5   Title She will increase her FOTO score to > 50 points to help with increased functional capacity upon discharge (04/17/2015)   Time 6   Period Weeks   Status New               Plan - 03/12/15 1624    Clinical Impression Statement Pts Niece expresses concern about the patient still using her SPC. She states the MD told her not to use it. BERG balance score administered with pt scoring 45/56. Pt feels more comfortable with SPC.  Encouraged her to always take her cane when leving  the home. Pt's niece is concerned about her knee motion and thinks it is only bending to 85 degrees however her motion was 13-105 on eval. Knee ROM primary focus today with pt and family educated on daily stretches to improve motion.    PT Next Visit Plan continue knee mobility, strength, balance, gait.         Problem List Patient Active Problem List   Diagnosis Date Noted  . Osteoarthritis of right knee 12/17/2014  . Status post total right knee replacement 12/17/2014  . POSITIVE TB SKIN TEST, WITHOUT TUBERCULOSIS 05/23/2009  . HYPERLIPIDEMIA 05/12/2009  . ANEMIA 05/12/2009  . HYPERTENSION 05/12/2009  . INTRACEREBRAL HEMORRHAGE 05/12/2009  . CVA WITH RIGHT HEMIPARESIS 05/12/2009  . DEGENERATIVE JOINT DISEASE 05/12/2009  . OSTEOPOROSIS 05/12/2009    Sherrie Mustacheonoho, Sharyn Brilliant McGee, PTA 03/12/2015, 4:45 PM  [facility] [address] [place], [place], [address] Phone: [phone]   Fax:  [phone]

## 2015-03-18 ENCOUNTER — Ambulatory Visit: Payer: Medicare HMO | Admitting: Physical Therapy

## 2015-03-18 DIAGNOSIS — M25661 Stiffness of right knee, not elsewhere classified: Secondary | ICD-10-CM

## 2015-03-18 DIAGNOSIS — M25561 Pain in right knee: Secondary | ICD-10-CM

## 2015-03-18 DIAGNOSIS — R269 Unspecified abnormalities of gait and mobility: Secondary | ICD-10-CM

## 2015-03-19 ENCOUNTER — Ambulatory Visit: Payer: Medicare HMO | Admitting: Physical Therapy

## 2015-03-19 NOTE — Therapy (Signed)
[facility] [address] [place], [place], [address] Phone: [phone]   Fax:  [phone]  Physical Therapy Treatment  Patient Details  Name: Gina Parker MRN: [id number] Date of Birth: [birth year] Referring Provider:  Kathryne Hitch*  Encounter Date: 03/18/2015      PT End of Session - 03/18/15 1403    Visit Number 4   Number of Visits 12   Date for PT Re-Evaluation 04/17/15   PT Start Time 0130   PT Stop Time 0230   PT Time Calculation (min) 60 min      Past Medical History  Diagnosis Date  . Hypertension     takes Amlodipine daily  . Hyperlipidemia     doesn't take any meds  . Shortness of breath dyspnea     with exertion  . History of bronchitis     many,many yrs ago  . Headache     couple of times a week  . Brain aneurysm     takes Keppra daily  . Confusion   . Weakness     tingling in both hand  . Carpal tunnel syndrome   . Arthritis   . Joint pain   . Joint swelling   . Back pain     arthritis  . GERD (gastroesophageal reflux disease)     takes pepcid daily  . Urinary frequency   . Nocturia   . Cataracts, bilateral     immautre  . Anxiety     takes Citalpram daily  . Insomnia   . PAF (paroxysmal atrial fibrillation)     03/2014 (Dr. Yates Decamp); not felt to be a candidate for anti-coag at that time due to h/o intracranial hemorrhage '10    Past Surgical History  Procedure Laterality Date  . Brain surgery  2007    aneurysm  . Joint replacement Left   . 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 Tracheostomy  04/2009  . Total knee arthroplasty Right 12/17/2014    Procedure: RIGHT TOTAL KNEE ARTHROPLASTY;  Surgeon: Kathryne Hitch, MD;  Location: [facility] OR;  Service: Orthopedics;  Laterality: Right;    There were no vitals filed for this visit.  Visit Diagnosis:  Right knee pain  Decreased ROM of right knee  Abnormality of gait  Stiffness of right knee      Subjective  Assessment - 03/18/15 1404    Currently in Pain? Yes   Pain Score 7    Pain Location Knee   Pain Orientation Right;Anterior   Pain Descriptors / Indicators Aching   Pain Frequency Intermittent   Aggravating Factors  sitting too long   Pain Relieving Factors exercise, massage            [facility] PT Assessment - 03/18/15 1414    AROM   Right Knee Extension -12   Right Knee Flexion 104                     [facility] Adult PT Treatment/Exercise - 03/19/15 0001    High Level Balance   High Level Balance Activities Side stepping;Backward walking;Direction changes;Turns;Tandem walking   High Level Balance Comments Also SLS with 2 finger support, retro tandem gait, SLS with vectors x 10 each way with 1 UE support for proprioception   Knee/Hip Exercises: Aerobic   Stationary Bike Nustep L3 x 6 min LE only   Knee/Hip Exercises: Standing   Other Standing Knee Exercises sit-stand x 15 without UE  Modalities   Modalities Cryotherapy   Cryotherapy   Number Minutes Cryotherapy 15 Minutes   Cryotherapy Location Knee   Type of Cryotherapy Ice pack                  PT Short Term Goals - 03/18/15 1500    PT SHORT TERM GOAL #1   Title She will be I with basic HEP (03/27/2015)   Time 3   Period Weeks   Status Achieved   PT SHORT TERM GOAL #2   Title She will increase her FOTO score by > 8 points to assist with increased functional capacity (03/27/2015)   Time 3   Period Weeks   Status On-going   PT SHORT TERM GOAL #3   Title She will be able to verbalize and demonstrate techniques to reduce R knee inflammation via RICE method (03/27/2015)   Time 3   Period Weeks   Status On-going           PT Long Term Goals - 03/18/15 1501    PT LONG TERM GOAL #1   Title pt will be I with advanced HEP given last visit (04/17/2015)   Time 6   Period Weeks   Status On-going   PT LONG TERM GOAL #2   Title She will increase R knee AROM to > 120 degrees to assist with funcitonal  and efficent gait (04/17/2015)   Time 6   Period Weeks   Status On-going   PT LONG TERM GOAL #3   Title She will decrease her 10 MWT by > 4 seconds to assist with funcitonal and safe community ambulation with LRAD (04/17/2015)   Time 6   Status On-going   PT LONG TERM GOAL #4   Title She will demonstrate < 2/10 pain during weight bearing activities for > 30  minutes and ascend/descend > 10 steps to assist with community ambulation and grocery shopping/ ADLs(04/17/2015   Period Weeks   Status On-going   PT LONG TERM GOAL #5   Title She will increase her FOTO score to > 50 points to help with increased functional capacity upon discharge (04/17/2015)   Time 6   Period Weeks   Status On-going               Plan - 03/18/15 0835    Clinical Impression Statement Focus today on balance. Per pts niece she has been taken off meds that she has been taking for memory and balance. She will be seeing the MD for an appt after she gets worked in. She has not seen her MD in 4 years but has continued to refill her medication without trouble. Her niece believes that this is why she is having balance issues and continues to insist the pt does not need a cane per her MD. The pt expresses the need for her spc therefore encourgaed to continue using her cane due to her BERG score and her fear of falling. She also has several episodes of stumbling throughout gait in clinic. Pt also admits that the caregiver with her today is not her niece. She is her nephew's girlfriend.    PT Next Visit Plan continue knee mobility, strength, balance, gait.         Problem List Patient Active Problem List   Diagnosis Date Noted  . Osteoarthritis of right knee 12/17/2014  . Status post total right knee replacement 12/17/2014  . POSITIVE TB SKIN TEST, WITHOUT TUBERCULOSIS 05/23/2009  . HYPERLIPIDEMIA 05/12/2009  .  ANEMIA 05/12/2009  . HYPERTENSION 05/12/2009  . INTRACEREBRAL HEMORRHAGE 05/12/2009  . CVA WITH RIGHT  HEMIPARESIS 05/12/2009  . DEGENERATIVE JOINT DISEASE 05/12/2009  . OSTEOPOROSIS 05/12/2009   Jannette Spanner, PTA 03/19/2015 8:41 AM Phone: [phone] Fax: [phone]  [facility] [address] [place], [place], [address] Phone: [phone]   Fax:  [phone]

## 2015-03-25 ENCOUNTER — Ambulatory Visit: Payer: Medicare HMO | Admitting: Physical Therapy

## 2015-03-25 DIAGNOSIS — M25561 Pain in right knee: Secondary | ICD-10-CM

## 2015-03-25 DIAGNOSIS — R269 Unspecified abnormalities of gait and mobility: Secondary | ICD-10-CM

## 2015-03-25 DIAGNOSIS — M25661 Stiffness of right knee, not elsewhere classified: Secondary | ICD-10-CM

## 2015-03-25 NOTE — Therapy (Signed)
[facility] [address] [place], [place], [address] Phone: [phone]   Fax:  [phone]  Physical Therapy Treatment  Patient Details  Name: Gina Parker MRN: [id number] Date of Birth: 11/04/1940 Referring Provider:  Kathryne Hitch*  Encounter Date: 03/25/2015      PT End of Session - 03/25/15 0933    Visit Number 5   Number of Visits 12   Date for PT Re-Evaluation 04/17/15   PT Start Time 0845   PT Stop Time 0930   PT Time Calculation (min) 45 min   Activity Tolerance Patient tolerated treatment well   Behavior During Therapy [facility] for tasks assessed/performed      Past Medical History  Diagnosis Date  . Hypertension     takes Amlodipine daily  . Hyperlipidemia     doesn't take any meds  . Shortness of breath dyspnea     with exertion  . History of bronchitis     many,many yrs ago  . Headache     couple of times a week  . Brain aneurysm     takes Keppra daily  . Confusion   . Weakness     tingling in both hand  . Carpal tunnel syndrome   . Arthritis   . Joint pain   . Joint swelling   . Back pain     arthritis  . GERD (gastroesophageal reflux disease)     takes pepcid daily  . Urinary frequency   . Nocturia   . Cataracts, bilateral     immautre  . Anxiety     takes Citalpram daily  . Insomnia   . PAF (paroxysmal atrial fibrillation)     03/2014 (Dr. Yates Decamp); not felt to be a candidate for anti-coag at that time due to h/o intracranial hemorrhage '10    Past Surgical History  Procedure Laterality Date  . Brain surgery  2007    aneurysm  . Joint replacement Left   . 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 Tracheostomy  04/2009  . Total knee arthroplasty Right 12/17/2014    Procedure: RIGHT TOTAL KNEE ARTHROPLASTY;  Surgeon: Kathryne Hitch, MD;  Location: [facility] OR;  Service: Orthopedics;  Laterality: Right;    There were no vitals filed for this visit.  Visit Diagnosis:   Right knee pain  Decreased ROM of right knee  Abnormality of gait  Stiffness of right knee      Subjective Assessment - 03/25/15 0854    Subjective "I've been doing ok, and have been trying to do the HEP." pt arrived to therapy today without using her SPC.    Currently in Pain? Yes   Pain Score 6    Pain Orientation Right;Anterior   Pain Descriptors / Indicators Aching   Pain Type Surgical pain   Pain Onset More than a month ago   Pain Frequency Intermittent            [facility] PT Assessment - 03/25/15 0900    AROM   Right Knee Extension -15  -10 following todays treatment   Right Knee Flexion 112  117 degrees following tx                     [facility] Adult PT Treatment/Exercise - 03/25/15 0001    Lumbar Exercises: Aerobic   Stationary Bike --   Knee/Hip Exercises: Stretches   Passive Hamstring Stretch 2 reps;30 seconds  PNF contract relax 10 sec hold  Knee/Hip Exercises: Aerobic   Stationary Bike L1 x 8 min recumbent bike  gradually lowering seat to increase knee flexion   Knee/Hip Exercises: Standing   Gait Training walking taking big steps x 40ft   without use of AD, but with contact guard for safety   Knee/Hip Exercises: Seated   Long Arc Quad Right;10 reps;Strengthening;AROM   Knee/Hip Exercises: Supine   Quad Sets Right;15 reps  with RL quad contraction, LLE with manual extension during    Short Arc Quad Sets AROM;Strengthening;Right;1 set;15 reps   Heel Slides Limitations 3 reps 30 sec onds with sheet   Straight Leg Raises AROM;Strengthening;Right;15 reps   Manual Therapy   Manual Therapy Joint mobilization;Passive ROM   Joint Mobilization Patella mobs all planes, knee flexion and extension mobs in supine   Passive ROM flexion and extension in supine                PT Education - 03/25/15 1236    Education provided Yes   Education Details performing quad sets bil to help facilitate R quad activitation   Person(s) Educated Patient    Methods Explanation   Comprehension Verbalized understanding          PT Short Term Goals - 03/18/15 1500    PT SHORT TERM GOAL #1   Title She will be I with basic HEP (03/27/2015)   Time 3   Period Weeks   Status Achieved   PT SHORT TERM GOAL #2   Title She will increase her FOTO score by > 8 points to assist with increased functional capacity (03/27/2015)   Time 3   Period Weeks   Status On-going   PT SHORT TERM GOAL #3   Title She will be able to verbalize and demonstrate techniques to reduce R knee inflammation via RICE method (03/27/2015)   Time 3   Period Weeks   Status On-going           PT Long Term Goals - 03/18/15 1501    PT LONG TERM GOAL #1   Title pt will be I with advanced HEP given last visit (04/17/2015)   Time 6   Period Weeks   Status On-going   PT LONG TERM GOAL #2   Title She will increase R knee AROM to > 120 degrees to assist with funcitonal and efficent gait (04/17/2015)   Time 6   Period Weeks   Status On-going   PT LONG TERM GOAL #3   Title She will decrease her 10 MWT by > 4 seconds to assist with funcitonal and safe community ambulation with LRAD (04/17/2015)   Time 6   Status On-going   PT LONG TERM GOAL #4   Title She will demonstrate < 2/10 pain during weight bearing activities for > 30  minutes and ascend/descend > 10 steps to assist with community ambulation and grocery shopping/ ADLs(04/17/2015   Period Weeks   Status On-going   PT LONG TERM GOAL #5   Title She will increase her FOTO score to > 50 points to help with increased functional capacity upon discharge (04/17/2015)   Time 6   Period Weeks   Status On-going               Plan - 03/25/15 0934    Clinical Impression Statement Arnetra presents to therapy without using her SPC (per neice she didn't need it so she is trying to get her to amb more without it). Increased R knee AROM to -10  to 117 following todays treatment. she tolerated strengthening exercises well without  increased pain or symptoms. on the way out worked on dynamic gait training with  performing walking marching  x 100 ft  requiring CGA for safety to work on step length and dynamic single leg balance.    PT Next Visit Plan continue knee mobility, strength, balance, gait.    Consulted and Agree with Plan of Care Patient   Family Member Consulted neice         Problem List Patient Active Problem List   Diagnosis Date Noted  . Osteoarthritis of right knee 12/17/2014  . Status post total right knee replacement 12/17/2014  . POSITIVE TB SKIN TEST, WITHOUT TUBERCULOSIS 05/23/2009  . HYPERLIPIDEMIA 05/12/2009  . ANEMIA 05/12/2009  . HYPERTENSION 05/12/2009  . INTRACEREBRAL HEMORRHAGE 05/12/2009  . CVA WITH RIGHT HEMIPARESIS 05/12/2009  . DEGENERATIVE JOINT DISEASE 05/12/2009  . OSTEOPOROSIS 05/12/2009   Lulu Riding PT, DPT, LAT, ATC  03/25/2015  12:45 PM       [facility] [address] [place], [place], [address] Phone: [phone]   Fax:  [phone]

## 2015-03-27 ENCOUNTER — Ambulatory Visit: Payer: Medicare HMO | Admitting: Physical Therapy

## 2015-03-27 DIAGNOSIS — M25561 Pain in right knee: Secondary | ICD-10-CM

## 2015-03-27 DIAGNOSIS — M25661 Stiffness of right knee, not elsewhere classified: Secondary | ICD-10-CM

## 2015-03-27 DIAGNOSIS — R269 Unspecified abnormalities of gait and mobility: Secondary | ICD-10-CM

## 2015-03-27 NOTE — Therapy (Signed)
[facility] [address] [place], [place], [address] Phone: [phone]   Fax:  [phone]  Physical Therapy Treatment  Patient Details  Name: Gina Parker MRN: [id number] Date of Birth: [birth year] Referring Provider:  Kathryne Hitch*  Encounter Date: 03/27/2015      PT End of Session - 03/27/15 1058    Visit Number 6   Number of Visits 12   Date for PT Re-Evaluation 04/17/15   PT Start Time 0845   PT Stop Time 0940   PT Time Calculation (min) 55 min   Activity Tolerance Patient tolerated treatment well   Behavior During Therapy [facility] for tasks assessed/performed      Past Medical History  Diagnosis Date  . Hypertension     takes Amlodipine daily  . Hyperlipidemia     doesn't take any meds  . Shortness of breath dyspnea     with exertion  . History of bronchitis     many,many yrs ago  . Headache     couple of times a week  . Brain aneurysm     takes Keppra daily  . Confusion   . Weakness     tingling in both hand  . Carpal tunnel syndrome   . Arthritis   . Joint pain   . Joint swelling   . Back pain     arthritis  . GERD (gastroesophageal reflux disease)     takes pepcid daily  . Urinary frequency   . Nocturia   . Cataracts, bilateral     immautre  . Anxiety     takes Citalpram daily  . Insomnia   . PAF (paroxysmal atrial fibrillation)     03/2014 (Dr. Yates Decamp); not felt to be a candidate for anti-coag at that time due to h/o intracranial hemorrhage '10    Past Surgical History  Procedure Laterality Date  . Brain surgery  2007    aneurysm  . Joint replacement Left   . 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 Tracheostomy  04/2009  . Total knee arthroplasty Right 12/17/2014    Procedure: RIGHT TOTAL KNEE ARTHROPLASTY;  Surgeon: Kathryne Hitch, MD;  Location: [facility] OR;  Service: Orthopedics;  Laterality: Right;    There were no vitals filed for this visit.  Visit Diagnosis:   Right knee pain  Decreased ROM of right knee  Abnormality of gait  Stiffness of right knee      Subjective Assessment - 03/27/15 0855    Subjective "I am doing well today, and have been practicing at home with walking, and doing my exercises" she reports her pain is 3/10 today.    Currently in Pain? Yes   Pain Score 3    Pain Location Knee   Pain Orientation Right;Anterior   Pain Descriptors / Indicators Aching   Pain Type Surgical pain   Pain Onset More than a month ago   Pain Frequency Intermittent            [facility] PT Assessment - 03/27/15 0001    AROM   Right Knee Extension -10  -18 extensor lag with SLR   Right Knee Flexion 114  117 following tx                     [facility] Adult PT Treatment/Exercise - 03/27/15 0858    Balance   Balance Assessed Yes   Static Standing Balance   Single Leg Stance - Right Leg 20  x  2   Single Leg Stance - Left Leg 20  x 2   Lumbar Exercises: Aerobic   Stationary Bike --   Knee/Hip Exercises: Stretches   Active Hamstring Stretch 3 reps;30 seconds  PNF contract relax technique   Knee/Hip Exercises: Aerobic   Stationary Bike L1 x 8 min recumbent bike   Knee/Hip Exercises: Standing   Terminal Knee Extension AROM;Strengthening;Right;1 set;15 reps   Theraband Level (Terminal Knee Extension) Level 3 (Green)   Forward Step Up Both;2 sets;10 reps;Step Height: 4";Step Height: 6"   Step Down Right;1 set;Step Height: 4";10 reps   Gait Training walking taking big steps x 75ft    Other Standing Knee Exercises sit-stand x 15 without UE    Knee/Hip Exercises: Seated   Long Arc Quad Right;10 reps;Strengthening;AROM;2 sets  2 1/2 #   Knee/Hip Exercises: Supine   Quad Sets Right;15 reps   Short Arc Quad Sets AROM;Strengthening;Right;1 set;15 reps   Heel Slides Limitations 3 reps 30 sec onds with sheet   Straight Leg Raises AROM;Strengthening;Right;1 set;10 reps  2 1/2 #   Cryotherapy   Number Minutes Cryotherapy 10 Minutes    Cryotherapy Location Knee   Type of Cryotherapy Other (comment)  vasopnuematic compression   Manual Therapy   Joint Mobilization Patella mobs all planes, knee A<>P grade 3 mobs in supine                PT Education - 03/27/15 1057    Education provided Yes   Education Details balance training education,    Person(s) Educated Patient   Methods Explanation   Comprehension Verbalized understanding          PT Short Term Goals - 03/18/15 1500    PT SHORT TERM GOAL #1   Title She will be I with basic HEP (03/27/2015)   Time 3   Period Weeks   Status Achieved   PT SHORT TERM GOAL #2   Title She will increase her FOTO score by > 8 points to assist with increased functional capacity (03/27/2015)   Time 3   Period Weeks   Status On-going   PT SHORT TERM GOAL #3   Title She will be able to verbalize and demonstrate techniques to reduce R knee inflammation via RICE method (03/27/2015)   Time 3   Period Weeks   Status On-going           PT Long Term Goals - 03/18/15 1501    PT LONG TERM GOAL #1   Title pt will be I with advanced HEP given last visit (04/17/2015)   Time 6   Period Weeks   Status On-going   PT LONG TERM GOAL #2   Title She will increase R knee AROM to > 120 degrees to assist with funcitonal and efficent gait (04/17/2015)   Time 6   Period Weeks   Status On-going   PT LONG TERM GOAL #3   Title She will decrease her 10 MWT by > 4 seconds to assist with funcitonal and safe community ambulation with LRAD (04/17/2015)   Time 6   Status On-going   PT LONG TERM GOAL #4   Title She will demonstrate < 2/10 pain during weight bearing activities for > 30  minutes and ascend/descend > 10 steps to assist with community ambulation and grocery shopping/ ADLs(04/17/2015   Period Weeks   Status On-going   PT LONG TERM GOAL #5   Title She will increase her FOTO score to > 50 points  to help with increased functional capacity upon discharge (04/17/2015)   Time 6    Period Weeks   Status On-going               Plan - 03/27/15 1058    Clinical Impression Statement Gina Parker continues to make progress with increased R knee AROM of 117 following todays treatment but continues to demonstrate an extensor lag during SLR of -18. She tolerated exercise well without increase in pain or symptoms. plan to progress with gait and balance training.    PT Next Visit Plan continue knee mobility, strength, balance, gait.    PT Home Exercise Plan HEP review   Consulted and Agree with Plan of Care Patient        Problem List Patient Active Problem List   Diagnosis Date Noted  . Osteoarthritis of right knee 12/17/2014  . Status post total right knee replacement 12/17/2014  . POSITIVE TB SKIN TEST, WITHOUT TUBERCULOSIS 05/23/2009  . HYPERLIPIDEMIA 05/12/2009  . ANEMIA 05/12/2009  . HYPERTENSION 05/12/2009  . INTRACEREBRAL HEMORRHAGE 05/12/2009  . CVA WITH RIGHT HEMIPARESIS 05/12/2009  . DEGENERATIVE JOINT DISEASE 05/12/2009  . OSTEOPOROSIS 05/12/2009   Lulu Riding PT, DPT, LAT, ATC  03/27/2015  11:03 AM    [facility] [address] [place], [place], [address] Phone: [phone]   Fax:  [phone]

## 2015-04-01 ENCOUNTER — Ambulatory Visit: Payer: Medicare HMO | Admitting: Physical Therapy

## 2015-04-01 DIAGNOSIS — M25661 Stiffness of right knee, not elsewhere classified: Secondary | ICD-10-CM

## 2015-04-01 DIAGNOSIS — R269 Unspecified abnormalities of gait and mobility: Secondary | ICD-10-CM

## 2015-04-01 DIAGNOSIS — M25561 Pain in right knee: Secondary | ICD-10-CM | POA: Diagnosis not present

## 2015-04-01 NOTE — Therapy (Addendum)
[place], [place], [address] Phone: [phone]   Fax:  [phone]  Physical Therapy Treatment  Patient Details  Name: Gina Parker MRN: [id number] Date of Birth: Jul 17, 1941 Referring Provider:  Mcarthur Rossetti*  Encounter Date: 04/01/2015      PT End of Session - 04/01/15 1242    Visit Number 7   Number of Visits 12   Date for PT Re-Evaluation 04/17/15   PT Start Time 1147   PT Stop Time 1241   PT Time Calculation (min) 54 min   Activity Tolerance Patient tolerated treatment well   Behavior During Therapy [facility] for tasks assessed/performed      Past Medical History  Diagnosis Date  . Hypertension     takes Amlodipine daily  . Hyperlipidemia     doesn't take any meds  . Shortness of breath dyspnea     with exertion  . History of bronchitis     many,many yrs ago  . Headache     couple of times a week  . Brain aneurysm     takes Keppra daily  . Confusion   . Weakness     tingling in both hand  . Carpal tunnel syndrome   . Arthritis   . Joint pain   . Joint swelling   . Back pain     arthritis  . GERD (gastroesophageal reflux disease)     takes pepcid daily  . Urinary frequency   . Nocturia   . Cataracts, bilateral     immautre  . Anxiety     takes Citalpram daily  . Insomnia   . PAF (paroxysmal atrial fibrillation)     03/2014 (Dr. Adrian Prows); not felt to be a candidate for anti-coag at that time due to h/o intracranial hemorrhage '10    Past Surgical History  Procedure Laterality Date  . Brain surgery  2007    aneurysm  . Joint replacement Left   . 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 Tracheostomy  04/2009  . Total knee arthroplasty Right 12/17/2014    Procedure: RIGHT TOTAL KNEE ARTHROPLASTY;  Surgeon: Mcarthur Rossetti, MD;  Location: [place];  Service: Orthopedics;  Laterality: Right;    There were no vitals filed for this visit.  Visit Diagnosis:   Right knee pain  Decreased ROM of right knee  Abnormality of gait  Stiffness of right knee      Subjective Assessment - 04/01/15 1146    Subjective Doing okay.    Currently in Pain? Yes   Pain Score 5    Pain Location Knee   Pain Orientation Right   Pain Descriptors / Indicators Throbbing   Pain Type Chronic pain   Pain Onset More than a month ago   Pain Frequency Intermittent   Aggravating Factors  up and down stairs    Pain Relieving Factors exercise                         [facility] Adult PT Treatment/Exercise - 04/01/15 1154    Lumbar Exercises: Aerobic   Stationary Bike L2; 8 min    Knee/Hip Exercises: Stretches   Active Hamstring Stretch 3 reps;30 seconds  PNF contract relax technique   Knee/Hip Exercises: Aerobic   Stationary Bike L1 x 8 min recumbent bike   Knee/Hip Exercises: Standing   Knee Flexion Strengthening;1 set;20 reps   Knee Flexion Limitations 5# ankle wt   Other  Standing Knee Exercises sit-stand x 15 and x20 without UE    Knee/Hip Exercises: Seated   Long Arc Quad Strengthening;Right;1 set;15 reps;Weights   Knee/Hip Exercises: Supine   Quad Sets Right;15 reps   Short Arc Quad Sets Strengthening;Right;2 sets;10 reps;15 reps   Short Arc Quad Sets Limitations 2nd set with 5# wts   Heel Slides Strengthening;2 sets;15 reps   Cryotherapy   Number Minutes Cryotherapy 10 Minutes   Cryotherapy Location Knee   Type of Cryotherapy Ice pack   Manual Therapy   Manual Therapy Joint mobilization   Joint Mobilization  knee A<>P grade 2/3 mobs in supine   Passive ROM knee extension in supine                  PT Short Term Goals - 03/18/15 1500    PT SHORT TERM GOAL #1   Title She will be I with basic HEP (03/27/2015)   Time 3   Period Weeks   Status Achieved   PT SHORT TERM GOAL #2   Title She will increase her FOTO score by > 8 points to assist with increased functional capacity (03/27/2015)   Time 3   Period Weeks   Status  On-going   PT SHORT TERM GOAL #3   Title She will be able to verbalize and demonstrate techniques to reduce R knee inflammation via RICE method (03/27/2015)   Time 3   Period Weeks   Status On-going           PT Long Term Goals - 03/18/15 1501    PT LONG TERM GOAL #1   Title pt will be I with advanced HEP given last visit (04/17/2015)   Time 6   Period Weeks   Status On-going   PT LONG TERM GOAL #2   Title She will increase R knee AROM to > 120 degrees to assist with funcitonal and efficent gait (04/17/2015)   Time 6   Period Weeks   Status On-going   PT LONG TERM GOAL #3   Title She will decrease her 10 MWT by > 4 seconds to assist with funcitonal and safe community ambulation with LRAD (04/17/2015)   Time 6   Status On-going   PT LONG TERM GOAL #4   Title She will demonstrate < 2/10 pain during weight bearing activities for > 30  minutes and ascend/descend > 10 steps to assist with community ambulation and grocery shopping/ ADLs(04/17/2015   Period Weeks   Status On-going   PT LONG TERM GOAL #5   Title She will increase her FOTO score to > 50 points to help with increased functional capacity upon discharge (04/17/2015)   Time 6   Period Weeks   Status On-going               Plan - 04/01/15 1250    Clinical Impression Statement weights were added to mat exercises to increase strength, no additional goals met; patient was walking without her cane and was observed stumble twice in clinic - caregiver requests that she walk without it althought patient feels more safe with her cane; patient encouraged to use SPC for safety and decreased fall risk    PT Next Visit Plan CKC strengthening activities, balance, cybex machines (leg press, knee flexion/extension strengthening)       During this treatment session, the therapist was present, participating in and directing the treatment.  Problem List Patient Active Problem List   Diagnosis Date Noted  . Osteoarthritis of  right  knee 12/17/2014  . Status post total right knee replacement 12/17/2014  . POSITIVE TB SKIN TEST, WITHOUT TUBERCULOSIS 05/23/2009  . HYPERLIPIDEMIA 05/12/2009  . ANEMIA 05/12/2009  . HYPERTENSION 05/12/2009  . INTRACEREBRAL HEMORRHAGE 05/12/2009  . CVA WITH RIGHT HEMIPARESIS 05/12/2009  . DEGENERATIVE JOINT DISEASE 05/12/2009  . OSTEOPOROSIS 05/12/2009   Denna Haggard, SPTA 04/01/2015, 1:02 PM  Dorene Ar, PTA 04/01/2015, 1:15 PM  [facility] [address] [place], [place], [address] Phone: [phone]   Fax:  [phone]

## 2015-04-03 ENCOUNTER — Ambulatory Visit: Payer: Medicare HMO | Admitting: Physical Therapy

## 2015-04-03 DIAGNOSIS — M25561 Pain in right knee: Secondary | ICD-10-CM

## 2015-04-03 DIAGNOSIS — M25661 Stiffness of right knee, not elsewhere classified: Secondary | ICD-10-CM

## 2015-04-03 DIAGNOSIS — R269 Unspecified abnormalities of gait and mobility: Secondary | ICD-10-CM

## 2015-04-03 NOTE — Therapy (Signed)
[place], [place], [address] Phone: [phone]   Fax:  [phone]  Physical Therapy Treatment  Patient Details  Name: Gina Parker MRN: [id number] Date of Birth: Jan 12, 1941 Referring Provider:  Mcarthur Rossetti*  Encounter Date: 04/03/2015      PT End of Session - 04/03/15 1257    Visit Number 8   Number of Visits 12   Date for PT Re-Evaluation 04/17/15   PT Start Time 2774   PT Stop Time 1110   PT Time Calculation (min) 55 min   Activity Tolerance Patient tolerated treatment well   Behavior During Therapy [facility] for tasks assessed/performed      Past Medical History  Diagnosis Date  . Hypertension     takes Amlodipine daily  . Hyperlipidemia     doesn't take any meds  . Shortness of breath dyspnea     with exertion  . History of bronchitis     many,many yrs ago  . Headache     couple of times a week  . Brain aneurysm     takes Keppra daily  . Confusion   . Weakness     tingling in both hand  . Carpal tunnel syndrome   . Arthritis   . Joint pain   . Joint swelling   . Back pain     arthritis  . GERD (gastroesophageal reflux disease)     takes pepcid daily  . Urinary frequency   . Nocturia   . Cataracts, bilateral     immautre  . Anxiety     takes Citalpram daily  . Insomnia   . PAF (paroxysmal atrial fibrillation)     03/2014 (Dr. Adrian Prows); not felt to be a candidate for anti-coag at that time due to h/o intracranial hemorrhage '10    Past Surgical History  Procedure Laterality Date  . Brain surgery  2007    aneurysm  . Joint replacement Left   . 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 Tracheostomy  04/2009  . Total knee arthroplasty Right 12/17/2014    Procedure: RIGHT TOTAL KNEE ARTHROPLASTY;  Surgeon: Mcarthur Rossetti, MD;  Location: [place];  Service: Orthopedics;  Laterality: Right;    There were no vitals filed for this visit.  Visit Diagnosis:   Right knee pain  Decreased ROM of right knee  Abnormality of gait  Stiffness of right knee      Subjective Assessment - 04/03/15 1021    Subjective "I have been doing good, I have been noticing that I have felt like I have caught my toes on the floor a little more often"   Pain Score 6    Pain Location Knee   Pain Orientation Right   Pain Descriptors / Indicators Throbbing   Pain Type Chronic pain   Pain Onset More than a month ago   Pain Frequency Intermittent            [facility] PT Assessment - 04/03/15 1029    AROM   Right Knee Extension -8   Right Knee Flexion 115                     [facility] Adult PT Treatment/Exercise - 04/03/15 1024    Knee/Hip Exercises: Stretches   Active Hamstring Stretch 3 reps;30 seconds   Knee/Hip Exercises: Aerobic   Stationary Bike L2 x 8 min  lowering seat at 4 min to increase knee flexion   Knee/Hip  Exercises: Standing   Knee Flexion Strengthening;1 set;20 reps   Knee Flexion Limitations 5# ankle wt   Terminal Knee Extension AROM;Strengthening;Right;1 set;15 reps  with weight    Theraband Level (Terminal Knee Extension) Level 3 (Green)   Forward Step Up Both;2 sets;10 reps;Step Height: 4";Step Height: 6"   Step Down Right;1 set;Step Height: 4";10 reps   Other Standing Knee Exercises sit-stand x 20  VC for only using LE   Other Standing Knee Exercises standing hip ext/abd  2 x 10 ea.    Knee/Hip Exercises: Seated   Long Arc Quad Strengthening;Right;1 set;15 reps;Weights   Long Arc Quad Weight 5 lbs.   Knee/Hip Exercises: Supine   Quad Sets Right;15 reps  with manual overpressure   Short Arc Quad Sets Strengthening;Right;2 sets;10 reps  1 set 3#, 1 set 5#   Straight Leg Raises AROM;Strengthening;Right;1 set;10 reps  3#   Cryotherapy   Number Minutes Cryotherapy 10 Minutes   Cryotherapy Location Knee   Type of Cryotherapy Ice pack   Manual Therapy   Manual Therapy Joint mobilization   Joint Mobilization  knee A<>P  grade 2/3 mobs in supine                PT Education - 04/03/15 1257    Education provided Yes   Education Details standing hip ext/ abd   Person(s) Educated Patient   Methods Explanation   Comprehension Verbalized understanding          PT Short Term Goals - 04/03/15 1302    PT SHORT TERM GOAL #1   Title She will be I with basic HEP (03/27/2015)   Time 3   Period Weeks   Status Achieved   PT SHORT TERM GOAL #2   Title She will increase her FOTO score by > 8 points to assist with increased functional capacity (03/27/2015)   Time 3   Period Weeks   Status On-going   PT SHORT TERM GOAL #3   Title She will be able to verbalize and demonstrate techniques to reduce R knee inflammation via RICE method (03/27/2015)   Time 3   Period Weeks   Status Achieved           PT Long Term Goals - 04/03/15 1303    PT LONG TERM GOAL #1   Title pt will be I with advanced HEP given last visit (04/17/2015)   Time 6   Period Weeks   Status On-going   PT LONG TERM GOAL #2   Title She will increase R knee AROM to > 120 degrees to assist with funcitonal and efficent gait (04/17/2015)   Time 6   Period Weeks   Status On-going   PT LONG TERM GOAL #3   Title She will decrease her 10 MWT by > 4 seconds to assist with funcitonal and safe community ambulation with LRAD (04/17/2015)   Baseline 17 seconds   Time 6   Period Weeks   Status On-going   PT LONG TERM GOAL #4   Title She will demonstrate < 2/10 pain during weight bearing activities for > 30  minutes and ascend/descend > 10 steps to assist with community ambulation and grocery shopping/ ADLs(04/17/2015   Time 6   Period Weeks   Status On-going   PT LONG TERM GOAL #5   Title She will increase her FOTO score to > 50 points to help with increased functional capacity upon discharge (04/17/2015)   Time 6   Period Weeks  Status On-going               Plan - 04/03/15 1258    Clinical Impression Statement Anasha reports  pain to a 5/10 today. She met STG 1 and 3 today. she continues to amb with intemrittent catching of her R foot on the floor. Educated about the benefit of using a SPC for safety which her "niece" reported she doesn't need it and tha she is always around to assist, advised her that people can still fall while being held onto and it could possible hurt her trying to catch her, but she stated her "auntie doesn't need her cane because shes only going to depend on it".  pt tolerated exercises with minimal incrase in soreness, she improved knee mobility to -8 to 115.  began static rhomberge and tandem balance today plan to progress with balance and strengthening as tolerated.    PT Next Visit Plan CKC strengthening activities, balance, modalities PRN, progress note, FOTO   PT Home Exercise Plan standing hip ext/ abd   Consulted and Agree with Plan of Care Patient        Problem List Patient Active Problem List   Diagnosis Date Noted  . Osteoarthritis of right knee 12/17/2014  . Status post total right knee replacement 12/17/2014  . POSITIVE TB SKIN TEST, WITHOUT TUBERCULOSIS 05/23/2009  . HYPERLIPIDEMIA 05/12/2009  . ANEMIA 05/12/2009  . HYPERTENSION 05/12/2009  . INTRACEREBRAL HEMORRHAGE 05/12/2009  . CVA WITH RIGHT HEMIPARESIS 05/12/2009  . DEGENERATIVE JOINT DISEASE 05/12/2009  . OSTEOPOROSIS 05/12/2009   Starr Lake PT, DPT, LAT, ATC  04/03/2015  1:05 PM    [place] [facility] [address] [place], [place], [address] Phone: [phone]   Fax:  [phone]

## 2015-04-03 NOTE — Patient Instructions (Signed)
   Afsana Liera PT, DPT, LAT, ATC  [facility] Phone: [phone]     

## 2015-04-08 ENCOUNTER — Ambulatory Visit: Payer: Medicare HMO | Admitting: Physical Therapy

## 2015-04-10 ENCOUNTER — Ambulatory Visit: Payer: Medicare HMO | Attending: Orthopaedic Surgery | Admitting: Physical Therapy

## 2015-04-10 DIAGNOSIS — M25661 Stiffness of right knee, not elsewhere classified: Secondary | ICD-10-CM | POA: Diagnosis present

## 2015-04-10 DIAGNOSIS — R269 Unspecified abnormalities of gait and mobility: Secondary | ICD-10-CM | POA: Diagnosis present

## 2015-04-10 DIAGNOSIS — R29898 Other symptoms and signs involving the musculoskeletal system: Secondary | ICD-10-CM | POA: Diagnosis present

## 2015-04-10 DIAGNOSIS — M25561 Pain in right knee: Secondary | ICD-10-CM | POA: Insufficient documentation

## 2015-04-10 NOTE — Therapy (Signed)
[facility] [address] [place], [place], [address] Phone: [phone]   Fax:  [phone]  Physical Therapy Treatment  Patient Details  Name: Gina Parker MRN: [id number] Date of Birth: 08/21/1941 Referring Provider:  Kathryne Hitch*  Encounter Date: 04/10/2015      PT End of Session - 04/10/15 1344    Visit Number 9   Number of Visits 12   Date for PT Re-Evaluation 04/17/15   PT Start Time 0936   PT Stop Time 1017   PT Time Calculation (min) 41 min   Activity Tolerance Patient tolerated treatment well   Behavior During Therapy [facility] for tasks assessed/performed      Past Medical History  Diagnosis Date  . Hypertension     takes Amlodipine daily  . Hyperlipidemia     doesn't take any meds  . Shortness of breath dyspnea     with exertion  . History of bronchitis     many,many yrs ago  . Headache     couple of times a week  . Brain aneurysm     takes Keppra daily  . Confusion   . Weakness     tingling in both hand  . Carpal tunnel syndrome   . Arthritis   . Joint pain   . Joint swelling   . Back pain     arthritis  . GERD (gastroesophageal reflux disease)     takes pepcid daily  . Urinary frequency   . Nocturia   . Cataracts, bilateral     immautre  . Anxiety     takes Citalpram daily  . Insomnia   . PAF (paroxysmal atrial fibrillation)     03/2014 (Dr. Yates Decamp); not felt to be a candidate for anti-coag at that time due to h/o intracranial hemorrhage '10    Past Surgical History  Procedure Laterality Date  . Brain surgery  2007    aneurysm  . Joint replacement Left   . 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 Tracheostomy  04/2009  . Total knee arthroplasty Right 12/17/2014    Procedure: RIGHT TOTAL KNEE ARTHROPLASTY;  Surgeon: Kathryne Hitch, MD;  Location: [facility];  Service: Orthopedics;  Laterality: Right;    There were no vitals filed for this visit.  Visit Diagnosis:   Right knee pain  Stiffness of right knee  Decreased ROM of right knee  Abnormality of gait      Subjective Assessment - 04/10/15 0939    Subjective Pain 8/10.    Currently in Pain? Yes   Pain Score 8    Pain Location Knee  Also has intermittant knee pain 8/10   Pain Orientation Right   Pain Descriptors / Indicators Aching   Pain Frequency Intermittent   Aggravating Factors  throbbing. walking   Pain Relieving Factors rubbing,ice   Multiple Pain Sites Yes                         [facility] Adult PT Treatment/Exercise - 04/10/15 0942    High Level Balance   High Level Balance Activities --  Weight shiftingto side diagonals no foot movement, minimal a   High Level Balance Comments tactile cues for shoulder rotation dynamic walking and pre gait exercise helpful..increased step length noted.   Knee/Hip Exercises: Stretches   Passive Hamstring Stretch 3 reps;30 seconds   Gastroc Stretch 3 reps;30 seconds  2 inch  step used.   Knee/Hip Exercises: Standing  Knee Flexion Strengthening   Knee Flexion Limitations 5 LBS ankle, 10 X   Hip ADduction 10 reps  each   Knee/Hip Exercises: Seated   Long Arc Quad Strengthening   Long Arc Coca-ColaQuad Weight --  5, 10 LBS   Long Arc Quad Limitations AA lift, 5 second hold, eccentric lowering 10 reps each   Knee/Hip Exercises: Supine   Bridges Limitations 10 reps with cues, min assist to hold feet in place.                PT Education - 04/10/15 1344    Education provided Yes   Education Details Psychiatristgait training   Person(s) Educated Patient;Other (comment)   Methods Explanation;Demonstration;Tactile cues;Verbal cues   Comprehension Verbalized understanding;Returned demonstration;Need further instruction          PT Short Term Goals - 04/03/15 1302    PT SHORT TERM GOAL #1   Title She will be I with basic HEP (03/27/2015)   Time 3   Period Weeks   Status Achieved   PT SHORT TERM GOAL #2   Title She will increase  her FOTO score by > 8 points to assist with increased functional capacity (03/27/2015)   Time 3   Period Weeks   Status On-going   PT SHORT TERM GOAL #3   Title She will be able to verbalize and demonstrate techniques to reduce R knee inflammation via RICE method (03/27/2015)   Time 3   Period Weeks   Status Achieved           PT Long Term Goals - 04/03/15 1303    PT LONG TERM GOAL #1   Title pt will be I with advanced HEP given last visit (04/17/2015)   Time 6   Period Weeks   Status On-going   PT LONG TERM GOAL #2   Title She will increase R knee AROM to > 120 degrees to assist with funcitonal and efficent gait (04/17/2015)   Time 6   Period Weeks   Status On-going   PT LONG TERM GOAL #3   Title She will decrease her 10 MWT by > 4 seconds to assist with funcitonal and safe community ambulation with LRAD (04/17/2015)   Baseline 17 seconds   Time 6   Period Weeks   Status On-going   PT LONG TERM GOAL #4   Title She will demonstrate < 2/10 pain during weight bearing activities for > 30  minutes and ascend/descend > 10 steps to assist with community ambulation and grocery shopping/ ADLs(04/17/2015   Time 6   Period Weeks   Status On-going   PT LONG TERM GOAL #5   Title She will increase her FOTO score to > 50 points to help with increased functional capacity upon discharge (04/17/2015)   Time 6   Period Weeks   Status On-going               Plan - 04/10/15 1345    Clinical Impression Statement Gait improved with cues.  closed chain and stretching prepared her for balance and gait work.  Pain decreased post session.   PT Next Visit Plan CKC strengthening activities, balance, modalities PRN, progress note, FOTO        Problem List Patient Active Problem List   Diagnosis Date Noted  . Osteoarthritis of right knee 12/17/2014  . Status post total right knee replacement 12/17/2014  . POSITIVE TB SKIN TEST, WITHOUT TUBERCULOSIS 05/23/2009  . HYPERLIPIDEMIA 05/12/2009   . ANEMIA  05/12/2009  . HYPERTENSION 05/12/2009  . INTRACEREBRAL HEMORRHAGE 05/12/2009  . CVA WITH RIGHT HEMIPARESIS 05/12/2009  . DEGENERATIVE JOINT DISEASE 05/12/2009  . OSTEOPOROSIS 05/12/2009    Deyon Chizek 04/10/2015, 1:48 PM  [facility] [address] [place], [place], [address] Phone: [phone]   Fax:  [phone]     Liz Beach, PTA 04/10/2015 1:48 PM Phone: [phone] Fax: [phone]

## 2015-04-21 ENCOUNTER — Ambulatory Visit: Payer: Medicare HMO | Admitting: Physical Therapy

## 2015-04-21 DIAGNOSIS — M25661 Stiffness of right knee, not elsewhere classified: Secondary | ICD-10-CM

## 2015-04-21 DIAGNOSIS — M25561 Pain in right knee: Secondary | ICD-10-CM

## 2015-04-21 DIAGNOSIS — R269 Unspecified abnormalities of gait and mobility: Secondary | ICD-10-CM

## 2015-04-21 NOTE — Patient Instructions (Signed)
   Doria Fern PT, DPT, LAT, ATC  [facility] Phone: [phone]     

## 2015-04-21 NOTE — Therapy (Signed)
[place], [place], [address] Phone: [phone]   Fax:  [phone]  Physical Therapy Treatment / Re-evaluation  Patient Details  Name: Gina Parker MRN: [id number] Date of Birth: Jun 22, 1941 Referring Provider:  Mcarthur Rossetti*  Encounter Date: 04/21/2015      PT End of Session - 04/21/15 1224    Visit Number 10   Number of Visits 18   Date for PT Re-Evaluation 05/19/15   PT Start Time 0850   PT Stop Time 0935   PT Time Calculation (min) 45 min   Activity Tolerance Patient tolerated treatment well;Patient limited by pain   Behavior During Therapy [facility] for tasks assessed/performed      Past Medical History  Diagnosis Date  . Hypertension     takes Amlodipine daily  . Hyperlipidemia     doesn't take any meds  . Shortness of breath dyspnea     with exertion  . History of bronchitis     many,many yrs ago  . Headache     couple of times a week  . Brain aneurysm     takes Keppra daily  . Confusion   . Weakness     tingling in both hand  . Carpal tunnel syndrome   . Arthritis   . Joint pain   . Joint swelling   . Back pain     arthritis  . GERD (gastroesophageal reflux disease)     takes pepcid daily  . Urinary frequency   . Nocturia   . Cataracts, bilateral     immautre  . Anxiety     takes Citalpram daily  . Insomnia   . PAF (paroxysmal atrial fibrillation)     03/2014 (Dr. Adrian Prows); not felt to be a candidate for anti-coag at that time due to h/o intracranial hemorrhage '10    Past Surgical History  Procedure Laterality Date  . Brain surgery  2007    aneurysm  . Joint replacement Left   . 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 Tracheostomy  04/2009  . Total knee arthroplasty Right 12/17/2014    Procedure: RIGHT TOTAL KNEE ARTHROPLASTY;  Surgeon: Mcarthur Rossetti, MD;  Location: [facility];  Service: Orthopedics;  Laterality: Right;    There were no vitals filed  for this visit.  Visit Diagnosis:  Right knee pain - Plan: PT plan of care cert/re-cert  Stiffness of right knee - Plan: PT plan of care cert/re-cert  Decreased ROM of right knee - Plan: PT plan of care cert/re-cert  Abnormality of gait - Plan: PT plan of care cert/re-cert      Subjective Assessment - 04/21/15 0857    Subjective "I have had more pain in my knee today and haven't been doing the exercises due to the pain    Currently in Pain? Yes   Pain Score 8    Pain Location Knee   Pain Orientation Right   Pain Descriptors / Indicators Aching   Pain Type Chronic pain   Pain Frequency Intermittent   Aggravating Factors  walking, and sitting            [facility] PT Assessment - 04/21/15 0914    Assessment   Medical Diagnosis R TKA   Onset Date/Surgical Date 12/17/14   Hand Dominance Right   Prior Therapy yes   Precautions   Precautions None   Restrictions   Weight Bearing Restrictions No   Balance Screen   Has the patient  fallen in the past 6 months No   Has the patient had a decrease in activity level because of a fear of falling?  No   Is the patient reluctant to leave their home because of a fear of falling?  No   Prior Function   Level of Independence Independent;Independent with basic ADLs;Independent with household mobility with device;Independent with community mobility with device;Independent with homemaking with ambulation;Independent with gait;Independent with transfers   Observation/Other Assessments   Focus on Therapeutic Outcomes (FOTO)  47% limited   AROM   Right Knee Extension -15  tightness at endrange   Right Knee Flexion 119   Strength   Right Knee Flexion 4/5   Right Knee Extension 4+/5   Palpation   Palpation comment tenderness located at the lateral joint line and inferior reported as burning that radiates to the head of the fibula                     [facility] Adult PT Treatment/Exercise - 04/21/15 0900    Lumbar Exercises: Aerobic    Stationary Bike --   Knee/Hip Exercises: Stretches   Passive Hamstring Stretch 3 reps;30 seconds   Gastroc Stretch 3 reps;30 seconds   Knee/Hip Exercises: Aerobic   Stationary Bike L2 x 8 min   Knee/Hip Exercises: Standing   Wall Squat 10 reps;1 set   Other Standing Knee Exercises sit-stand x 20   Other Standing Knee Exercises standing hamstring curls x 10   Knee/Hip Exercises: Supine   Quad Sets Right;15 reps   Manual Therapy   Manual Therapy Joint mobilization;Myofascial release   Joint Mobilization  knee A<>P grade 2/3 mobs in supine   Myofascial Release instrument assisted STM focused on lateral rectus and proximal to fibular head.  using roc blade, pt reported relief                 PT Education - 04/21/15 1224    Education provided Yes   Education Details HEP review, and updated exercises   Person(s) Educated Patient   Methods Explanation   Comprehension Verbalized understanding          PT Short Term Goals - 04/21/15 1233    PT SHORT TERM GOAL #1   Title She will be I with basic HEP (03/27/2015)   Time 3   Period Weeks   Status Achieved   PT SHORT TERM GOAL #2   Title She will increase her FOTO score by > 8 points to assist with increased functional capacity (03/27/2015)   Time 3   Period Weeks   Status Achieved   PT SHORT TERM GOAL #3   Title She will be able to verbalize and demonstrate techniques to reduce R knee inflammation via RICE method (03/27/2015)   Time 3   Period Weeks   Status Achieved           PT Long Term Goals - 04/21/15 1235    PT LONG TERM GOAL #1   Title pt will be I with advanced HEP given last visit (04/17/2015)   Time 6   Period Weeks   Status On-going   PT LONG TERM GOAL #2   Title She will increase R knee AROM to > 120 degrees to assist with funcitonal and efficent gait (04/17/2015)   Time 6   Period Weeks   Status On-going   PT LONG TERM GOAL #3   Title She will decrease her 10 MWT by > 4 seconds to assist  with  funcitonal and safe community ambulation with LRAD (04/17/2015)   Baseline 17 seconds   Time 6   Period Weeks   Status On-going   PT LONG TERM GOAL #4   Title She will demonstrate < 2/10 pain during weight bearing activities for > 30  minutes and ascend/descend > 10 steps to assist with community ambulation and grocery shopping/ ADLs(04/17/2015   Time 6   Period Weeks   Status On-going   PT LONG TERM GOAL #5   Title She will increase her FOTO score to > 50 points to help with increased functional capacity upon discharge (04/17/2015)   Time 6   Period Weeks   Status Achieved               Plan - 2015/05/14 1226    Clinical Impression Statement Louisa presents to therapy today with report of increased pain and soreness with pain at the lateral inferior aspect of the her knee reported as burning. she has improved AROM flexion to 119 but decreased knee extension to -15 with pain t endrange. PT continues to ambulate without an assistive device, and caught her toe on the floor while walking 4 x requiring HHA for support and safety. Continue to educate she would benfit from using her [facility] for support for those times she loses her balance but pt's "niece" continues to report she doesn't need it. She met all STG and LTG #5. She would benefit from continued therapy to increase her balance, strength and AROM.    Pt will benefit from skilled therapeutic intervention in order to improve on the following deficits Abnormal gait;Decreased balance;Decreased activity tolerance;Decreased endurance;Decreased mobility;Decreased range of motion;Decreased strength;Increased edema;Difficulty walking;Hypomobility;Increased muscle spasms;Impaired flexibility;Pain   Rehab Potential Good   PT Frequency 2x / week   PT Duration 4 weeks   PT Treatment/Interventions ADLs/Self Care Home Management;Electrical Stimulation;Cryotherapy;Moist Heat;Ultrasound;DME Instruction;Gait training;Stair training;Therapeutic  activities;Therapeutic exercise;Balance training;Neuromuscular re-education;Patient/family education;Manual techniques;Passive range of motion;Taping;Vasopneumatic Device   PT Next Visit Plan CKC strengthening activities, balance, modalities PRN, gait training,    PT Home Exercise Plan HEP review, added hamstring curls, standing hip ext/ abd,    Consulted and Agree with Plan of Care Patient;Family member/caregiver   Family Member Consulted "neice"          G-Codes - 14-May-2015 1239    Functional Assessment Tool Used FOTO 47% limited   Functional Limitation Mobility: Walking and moving around   Mobility: Walking and Moving Around Current Status [phone]) At least 40 percent but less than 60 percent impaired, limited or restricted   Mobility: Walking and Moving Around Goal Status [phone]) At least 40 percent but less than 60 percent impaired, limited or restricted      Problem List Patient Active Problem List   Diagnosis Date Noted  . Osteoarthritis of right knee 12/17/2014  . Status post total right knee replacement 12/17/2014  . POSITIVE TB SKIN TEST, WITHOUT TUBERCULOSIS 05/23/2009  . HYPERLIPIDEMIA 05/12/2009  . ANEMIA 05/12/2009  . HYPERTENSION 05/12/2009  . INTRACEREBRAL HEMORRHAGE 05/12/2009  . CVA WITH RIGHT HEMIPARESIS 05/12/2009  . DEGENERATIVE JOINT DISEASE 05/12/2009  . OSTEOPOROSIS 05/12/2009   Starr Lake PT, DPT, LAT, ATC  05-14-2015  12:45 PM    [facility] [address] [place], [place], [address] Phone: [phone]   Fax:  [phone]

## 2015-04-24 ENCOUNTER — Ambulatory Visit: Payer: Medicare HMO | Admitting: Physical Therapy

## 2015-04-24 DIAGNOSIS — M25561 Pain in right knee: Secondary | ICD-10-CM

## 2015-04-24 DIAGNOSIS — R269 Unspecified abnormalities of gait and mobility: Secondary | ICD-10-CM

## 2015-04-24 DIAGNOSIS — M25661 Stiffness of right knee, not elsewhere classified: Secondary | ICD-10-CM

## 2015-04-24 NOTE — Therapy (Signed)
[facility] [address] [place], [place], [address] Phone: [phone]   Fax:  [phone]  Physical Therapy Treatment  Patient Details  Name: Gina Parker MRN: [id number] Date of Birth: [birth year] Referring Provider:  Kathryne Hitch*  Encounter Date: 04/24/2015      PT End of Session - 04/24/15 1058    Visit Number 11   Number of Visits 18   Date for PT Re-Evaluation 05/19/15   PT Start Time 1015   PT Stop Time 1110   PT Time Calculation (min) 55 min   Activity Tolerance Patient tolerated treatment well   Behavior During Therapy [facility] for tasks assessed/performed      Past Medical History  Diagnosis Date  . Hypertension     takes Amlodipine daily  . Hyperlipidemia     doesn't take any meds  . Shortness of breath dyspnea     with exertion  . History of bronchitis     many,many yrs ago  . Headache     couple of times a week  . Brain aneurysm     takes Keppra daily  . Confusion   . Weakness     tingling in both hand  . Carpal tunnel syndrome   . Arthritis   . Joint pain   . Joint swelling   . Back pain     arthritis  . GERD (gastroesophageal reflux disease)     takes pepcid daily  . Urinary frequency   . Nocturia   . Cataracts, bilateral     immautre  . Anxiety     takes Citalpram daily  . Insomnia   . PAF (paroxysmal atrial fibrillation)     03/2014 (Dr. Yates Decamp); not felt to be a candidate for anti-coag at that time due to h/o intracranial hemorrhage '10    Past Surgical History  Procedure Laterality Date  . Brain surgery  2007    aneurysm  . Joint replacement Left   . 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 Tracheostomy  04/2009  . Total knee arthroplasty Right 12/17/2014    Procedure: RIGHT TOTAL KNEE ARTHROPLASTY;  Surgeon: Kathryne Hitch, MD;  Location: [facility] OR;  Service: Orthopedics;  Laterality: Right;    There were no vitals filed for this visit.  Visit Diagnosis:   Right knee pain  Stiffness of right knee  Decreased ROM of right knee  Abnormality of gait      Subjective Assessment - 04/24/15 1016    Subjective " I've been doing my exercises and I have only a little pain in the knee today at a 4/10"   Currently in Pain? Yes   Pain Score 4    Pain Location Knee   Pain Orientation Right   Pain Descriptors / Indicators Aching   Pain Type Chronic pain   Pain Onset More than a month ago   Pain Frequency Intermittent   Aggravating Factors  walking, and bending   Pain Relieving Factors rubbing and ice                         [facility] Adult PT Treatment/Exercise - 04/24/15 1017    Knee/Hip Exercises: Stretches   Active Hamstring Stretch 3 reps;30 seconds  PNF contract relax x 10 sec contraction   Gastroc Stretch 2 reps;30 seconds   Soleus Stretch 2 reps;30 seconds   Knee/Hip Exercises: Aerobic   Stationary Bike L1 x 10 min  lowering seat at  5 min   Knee/Hip Exercises: Machines for Strengthening   Cybex Knee Flexion 30# 1 x 8, 45# 1x 8   Knee/Hip Exercises: Standing   Forward Step Up Both;2 sets;10 reps;Step Height: 4";Step Height: 6"   Step Down Right;Step Height: 4";10 reps;1 set   Wall Squat 10 reps;1 set   Other Standing Knee Exercises sit-stand x 20   Other Standing Knee Exercises standing hamstring curls x 15, standing hip ext/abd 2 x 10 ea.    Knee/Hip Exercises: Seated   Long Arc Quad AROM;Strengthening;Right;1 set;15 reps   Long Arc Quad Weight 5 lbs.   Knee/Hip Exercises: Supine   Quad Sets --   Straight Leg Raises AROM;Strengthening;Right;10 reps;2 sets  with maintained quad set, with 5#   Cryotherapy   Number Minutes Cryotherapy 10 Minutes   Cryotherapy Location Knee   Type of Cryotherapy Ice pack   Manual Therapy   Joint Mobilization  knee A<>P grade 2/3 mobs in supine                PT Education - 04/24/15 1058    Education provided Yes   Education Details HEP review   Person(s) Educated  Patient   Methods Explanation   Comprehension Verbalized understanding;Returned demonstration          PT Short Term Goals - 04/21/15 1233    PT SHORT TERM GOAL #1   Title She will be I with basic HEP (03/27/2015)   Time 3   Period Weeks   Status Achieved   PT SHORT TERM GOAL #2   Title She will increase her FOTO score by > 8 points to assist with increased functional capacity (03/27/2015)   Time 3   Period Weeks   Status Achieved   PT SHORT TERM GOAL #3   Title She will be able to verbalize and demonstrate techniques to reduce R knee inflammation via RICE method (03/27/2015)   Time 3   Period Weeks   Status Achieved           PT Long Term Goals - 04/21/15 1235    PT LONG TERM GOAL #1   Title pt will be I with advanced HEP given last visit (04/17/2015)   Time 6   Period Weeks   Status On-going   PT LONG TERM GOAL #2   Title She will increase R knee AROM to > 120 degrees to assist with funcitonal and efficent gait (04/17/2015)   Time 6   Period Weeks   Status On-going   PT LONG TERM GOAL #3   Title She will decrease her 10 MWT by > 4 seconds to assist with funcitonal and safe community ambulation with LRAD (04/17/2015)   Baseline 17 seconds   Time 6   Period Weeks   Status On-going   PT LONG TERM GOAL #4   Title She will demonstrate < 2/10 pain during weight bearing activities for > 30  minutes and ascend/descend > 10 steps to assist with community ambulation and grocery shopping/ ADLs(04/17/2015   Time 6   Period Weeks   Status On-going   PT LONG TERM GOAL #5   Title She will increase her FOTO score to > 50 points to help with increased functional capacity upon discharge (04/17/2015)   Time 6   Period Weeks   Status Achieved               Plan - 04/24/15 1058    Clinical Impression Statement Tiana reports that she is  doing better today compared to last visit. Focused todays treatment mostly on CKC exercises to promote hip and knee strengthening. she  tolerated all exercises well with only complaint of feeling tired. Plan to progress with CKC activities and progress with static/ dynamic balance   PT Next Visit Plan CKC strengthening activities, balance, modalities PRN, gait training,    PT Home Exercise Plan HEP review   Consulted and Agree with Plan of Care Patient        Problem List Patient Active Problem List   Diagnosis Date Noted  . Osteoarthritis of right knee 12/17/2014  . Status post total right knee replacement 12/17/2014  . POSITIVE TB SKIN TEST, WITHOUT TUBERCULOSIS 05/23/2009  . HYPERLIPIDEMIA 05/12/2009  . ANEMIA 05/12/2009  . HYPERTENSION 05/12/2009  . INTRACEREBRAL HEMORRHAGE 05/12/2009  . CVA WITH RIGHT HEMIPARESIS 05/12/2009  . DEGENERATIVE JOINT DISEASE 05/12/2009  . OSTEOPOROSIS 05/12/2009   Lulu Riding PT, DPT, LAT, ATC  04/24/2015  11:01 AM    [facility] [address] [place], [place], [address] Phone: [phone]   Fax:  [phone]

## 2015-05-07 ENCOUNTER — Ambulatory Visit: Payer: Medicare HMO | Attending: Orthopaedic Surgery | Admitting: Physical Therapy

## 2015-05-07 DIAGNOSIS — M25561 Pain in right knee: Secondary | ICD-10-CM

## 2015-05-07 DIAGNOSIS — R269 Unspecified abnormalities of gait and mobility: Secondary | ICD-10-CM | POA: Insufficient documentation

## 2015-05-07 DIAGNOSIS — R29898 Other symptoms and signs involving the musculoskeletal system: Secondary | ICD-10-CM | POA: Diagnosis present

## 2015-05-07 DIAGNOSIS — M25661 Stiffness of right knee, not elsewhere classified: Secondary | ICD-10-CM | POA: Diagnosis not present

## 2015-05-07 NOTE — Therapy (Signed)
[facility] [address] [place], [place], [address] Phone: [phone]   Fax:  [phone]  Physical Therapy Treatment  Patient Details  Name: Gina Parker MRN: [id number] Date of Birth: [birth year] Referring Provider:  Kathryne Hitch*  Encounter Date: 05/07/2015      PT End of Session - 05/07/15 1417    Visit Number 12   Number of Visits 18   Date for PT Re-Evaluation 05/19/15   PT Start Time 1328   PT Stop Time 1428   PT Time Calculation (min) 60 min   Activity Tolerance Patient tolerated treatment well   Behavior During Therapy [facility] for tasks assessed/performed      Past Medical History  Diagnosis Date  . Hypertension     takes Amlodipine daily  . Hyperlipidemia     doesn't take any meds  . Shortness of breath dyspnea     with exertion  . History of bronchitis     many,many yrs ago  . Headache     couple of times a week  . Brain aneurysm     takes Keppra daily  . Confusion   . Weakness     tingling in both hand  . Carpal tunnel syndrome   . Arthritis   . Joint pain   . Joint swelling   . Back pain     arthritis  . GERD (gastroesophageal reflux disease)     takes pepcid daily  . Urinary frequency   . Nocturia   . Cataracts, bilateral     immautre  . Anxiety     takes Citalpram daily  . Insomnia   . PAF (paroxysmal atrial fibrillation)     03/2014 (Dr. Yates Decamp); not felt to be a candidate for anti-coag at that time due to h/o intracranial hemorrhage '10    Past Surgical History  Procedure Laterality Date  . Brain surgery  2007    aneurysm  . Joint replacement Left   . 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 Tracheostomy  04/2009  . Total knee arthroplasty Right 12/17/2014    Procedure: RIGHT TOTAL KNEE ARTHROPLASTY;  Surgeon: Kathryne Hitch, MD;  Location: [facility] OR;  Service: Orthopedics;  Laterality: Right;    There were no vitals filed for this visit.  Visit Diagnosis:   Stiffness of right knee  Decreased ROM of right knee  Abnormality of gait  Right knee pain      Subjective Assessment - 05/07/15 1328    Subjective Walking for exercise 10 minutes, working with balance at home.  4/10 pain   Currently in Pain? Yes   Pain Score 4    Pain Location Knee   Pain Orientation Right;Anterior   Pain Descriptors / Indicators Aching   Aggravating Factors  cloudy weather   Pain Relieving Factors rubbing, ice   Multiple Pain Sites No                         [facility] Adult PT Treatment/Exercise - 05/07/15 1341    Knee/Hip Exercises: Stretches   Gastroc Stretch 3 reps;30 seconds  on step   Knee/Hip Exercises: Aerobic   Stationary Bike L3, 6 minutes with 1 brief rest.     Knee/Hip Exercises: Standing   Forward Step Up Right;Hand Hold: 2;Step Height: 8"  cues to use hands less   Wall Squat 5 reps;5 seconds   Other Standing Knee Exercises stand Rt slide pillow case 5 X each  behind, side and 7 reps forward   Knee/Hip Exercises: Supine   Quad Sets 10 reps  patient had no quad contraction.  she was using gluteals cue   Cryotherapy   Number Minutes Cryotherapy 10 Minutes   Cryotherapy Location Knee   Type of Cryotherapy --  cold pack                PT Education - 05/07/15 1417    Education provided Yes   Education Details quad sets   Person(s) Educated Patient;Child(ren)   Methods Explanation;Demonstration;Tactile cues;Verbal cues   Comprehension Verbalized understanding;Returned demonstration          PT Short Term Goals - 04/21/15 1233    PT SHORT TERM GOAL #1   Title She will be I with basic HEP (03/27/2015)   Time 3   Period Weeks   Status Achieved   PT SHORT TERM GOAL #2   Title She will increase her FOTO score by > 8 points to assist with increased functional capacity (03/27/2015)   Time 3   Period Weeks   Status Achieved   PT SHORT TERM GOAL #3   Title She will be able to verbalize and demonstrate techniques to  reduce R knee inflammation via RICE method (03/27/2015)   Time 3   Period Weeks   Status Achieved           PT Long Term Goals - 04/21/15 1235    PT LONG TERM GOAL #1   Title pt will be I with advanced HEP given last visit (04/17/2015)   Time 6   Period Weeks   Status On-going   PT LONG TERM GOAL #2   Title She will increase R knee AROM to > 120 degrees to assist with funcitonal and efficent gait (04/17/2015)   Time 6   Period Weeks   Status On-going   PT LONG TERM GOAL #3   Title She will decrease her 10 MWT by > 4 seconds to assist with funcitonal and safe community ambulation with LRAD (04/17/2015)   Baseline 17 seconds   Time 6   Period Weeks   Status On-going   PT LONG TERM GOAL #4   Title She will demonstrate < 2/10 pain during weight bearing activities for > 30  minutes and ascend/descend > 10 steps to assist with community ambulation and grocery shopping/ ADLs(04/17/2015   Time 6   Period Weeks   Status On-going   PT LONG TERM GOAL #5   Title She will increase her FOTO score to > 50 points to help with increased functional capacity upon discharge (04/17/2015)   Time 6   Period Weeks   Status Achieved               Plan - 05/07/15 1418    Clinical Impression Statement 3/10 oost sessuion.  poor quad strength end range extension   PT Next Visit Plan terminal knee extension work   Consulted and Agree with Plan of Care Patient;Family member/caregiver   Family Member Consulted "neice"        Problem List Patient Active Problem List   Diagnosis Date Noted  . Osteoarthritis of right knee 12/17/2014  . Status post total right knee replacement 12/17/2014  . POSITIVE TB SKIN TEST, WITHOUT TUBERCULOSIS 05/23/2009  . HYPERLIPIDEMIA 05/12/2009  . ANEMIA 05/12/2009  . HYPERTENSION 05/12/2009  . INTRACEREBRAL HEMORRHAGE 05/12/2009  . CVA WITH RIGHT HEMIPARESIS 05/12/2009  . DEGENERATIVE JOINT DISEASE 05/12/2009  . OSTEOPOROSIS 05/12/2009  [facility] 05/07/2015, 2:20 PM  [facility] [address] [place], [place], [address] Phone: [phone]   Fax:  [phone]     Liz Beach, PTA 05/07/2015 2:20 PM Phone: [phone] Fax: [phone]

## 2015-05-13 ENCOUNTER — Ambulatory Visit: Payer: Medicare HMO | Admitting: Physical Therapy

## 2015-05-13 DIAGNOSIS — M25661 Stiffness of right knee, not elsewhere classified: Secondary | ICD-10-CM | POA: Diagnosis not present

## 2015-05-13 DIAGNOSIS — M25561 Pain in right knee: Secondary | ICD-10-CM

## 2015-05-13 DIAGNOSIS — R269 Unspecified abnormalities of gait and mobility: Secondary | ICD-10-CM

## 2015-05-13 NOTE — Therapy (Signed)
[facility] [address] [place], [place], [address] Phone: [phone]   Fax:  [phone]  Physical Therapy Treatment  Patient Details  Name: Gina Parker MRN: [id number] Date of Birth: 05-03-1941 Referring Provider:  Kathryne Hitch*  Encounter Date: 05/13/2015      PT End of Session - 05/13/15 1015    Visit Number 13   Number of Visits 18   Date for PT Re-Evaluation 05/19/15   PT Start Time 0930   PT Stop Time 1015   PT Time Calculation (min) 45 min   Activity Tolerance Patient tolerated treatment well   Behavior During Therapy [facility] for tasks assessed/performed      Past Medical History  Diagnosis Date  . Hypertension     takes Amlodipine daily  . Hyperlipidemia     doesn't take any meds  . Shortness of breath dyspnea     with exertion  . History of bronchitis     many,many yrs ago  . Headache     couple of times a week  . Brain aneurysm     takes Keppra daily  . Confusion   . Weakness     tingling in both hand  . Carpal tunnel syndrome   . Arthritis   . Joint pain   . Joint swelling   . Back pain     arthritis  . GERD (gastroesophageal reflux disease)     takes pepcid daily  . Urinary frequency   . Nocturia   . Cataracts, bilateral     immautre  . Anxiety     takes Citalpram daily  . Insomnia   . PAF (paroxysmal atrial fibrillation)     03/2014 (Dr. Yates Decamp); not felt to be a candidate for anti-coag at that time due to h/o intracranial hemorrhage '10    Past Surgical History  Procedure Laterality Date  . Brain surgery  2007    aneurysm  . Joint replacement Left   . 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 Tracheostomy  04/2009  . Total knee arthroplasty Right 12/17/2014    Procedure: RIGHT TOTAL KNEE ARTHROPLASTY;  Surgeon: Kathryne Hitch, MD;  Location: [facility] OR;  Service: Orthopedics;  Laterality: Right;    There were no vitals filed for this visit.  Visit Diagnosis:   Stiffness of right knee  Decreased ROM of right knee  Abnormality of gait  Right knee pain      Subjective Assessment - 05/13/15 0939    Subjective "My pain isn't too bad today at 3/10, I've been walking and doing my exercises"   Currently in Pain? Yes   Pain Score 3    Pain Location Knee   Pain Orientation Right;Anterior   Pain Descriptors / Indicators Aching   Pain Type Chronic pain   Pain Onset More than a month ago   Pain Frequency Intermittent   Aggravating Factors  cloudy weather   Pain Relieving Factors rubbing ice                         [facility] Adult PT Treatment/Exercise - 05/13/15 0942    Knee/Hip Exercises: Stretches   Gastroc Stretch 2 reps;30 seconds   Soleus Stretch 2 reps;30 seconds   Knee/Hip Exercises: Aerobic   Stationary Bike L2 x 10 min   Knee/Hip Exercises: Machines for Strengthening   Cybex Knee Extension 1 plate x 10   Cybex Knee Flexion 30# 1 x 8, 45# 1x  8   Knee/Hip Exercises: Standing   Terminal Knee Extension AROM;Strengthening;Right;1 set;15 reps;Theraband  stepping on to 4in step   Theraband Level (Terminal Knee Extension) Level 3 (Green)   Forward Step Up Right;Step Height: 8";Hand Hold: 1;1 set;15 reps   Step Down Right;Step Height: 4";2 sets;10 reps   Wall Squat 10 reps;5 seconds;1 set   Knee/Hip Exercises: Supine   Quad Sets AROM;Strengthening;Right;15 reps;1 set  with ball underneath knee   Straight Leg Raises AROM;Strengthening;Right;10 reps;1 set  4#                PT Education - 05/13/15 1015    Education provided Yes   Education Details HEP review, performing quad set with pillow beneath knee   Person(s) Educated Patient   Methods Explanation   Comprehension Verbalized understanding          PT Short Term Goals - 04/21/15 1233    PT SHORT TERM GOAL #1   Title She will be I with basic HEP (03/27/2015)   Time 3   Period Weeks   Status Achieved   PT SHORT TERM GOAL #2   Title She will increase  her FOTO score by > 8 points to assist with increased functional capacity (03/27/2015)   Time 3   Period Weeks   Status Achieved   PT SHORT TERM GOAL #3   Title She will be able to verbalize and demonstrate techniques to reduce R knee inflammation via RICE method (03/27/2015)   Time 3   Period Weeks   Status Achieved           PT Long Term Goals - 05/13/15 1022    PT LONG TERM GOAL #1   Title pt will be I with advanced HEP given last visit (04/17/2015)   Time 6   Period Weeks   Status On-going   PT LONG TERM GOAL #2   Title She will increase R knee AROM to > 120 degrees to assist with funcitonal and efficent gait (04/17/2015)   Time 6   Period Weeks   Status On-going   PT LONG TERM GOAL #3   Title She will decrease her 10 MWT by > 4 seconds to assist with funcitonal and safe community ambulation with LRAD (04/17/2015)   Baseline 17 seconds   Time 6   Period Weeks   Status On-going   PT LONG TERM GOAL #4   Title She will demonstrate < 2/10 pain during weight bearing activities for > 30  minutes and ascend/descend > 10 steps to assist with community ambulation and grocery shopping/ ADLs(04/17/2015   Time 6   Period Weeks   Status On-going   PT LONG TERM GOAL #5   Title She will increase her FOTO score to > 50 points to help with increased functional capacity upon discharge (04/17/2015)   Time 6   Period Weeks   Status Achieved               Plan - 05/13/15 1019    Clinical Impression Statement Zaylah reports she has been doing her HEP at home. She continues to demonstrate limited quad strength during quad sets / terminal knee extension.  She was able to elicit  a better quad contraction when a ball was placed beneath the knee. Attempted to step ups without holding on but she was unable to perform and required HHA.   PT Next Visit Plan terminal knee extension work, balance training, stair stepping without HHA, modalities PRN,  PT Home Exercise Plan HEP review    Consulted and Agree with Plan of Care Patient        Problem List Patient Active Problem List   Diagnosis Date Noted  . Osteoarthritis of right knee 12/17/2014  . Status post total right knee replacement 12/17/2014  . POSITIVE TB SKIN TEST, WITHOUT TUBERCULOSIS 05/23/2009  . HYPERLIPIDEMIA 05/12/2009  . ANEMIA 05/12/2009  . HYPERTENSION 05/12/2009  . INTRACEREBRAL HEMORRHAGE 05/12/2009  . CVA WITH RIGHT HEMIPARESIS 05/12/2009  . DEGENERATIVE JOINT DISEASE 05/12/2009  . OSTEOPOROSIS 05/12/2009   Lulu Riding PT, DPT, LAT, ATC  05/13/2015  10:29 AM      [facility] [address] [place], [place], [address] Phone: [phone]   Fax:  [phone]

## 2015-05-15 ENCOUNTER — Ambulatory Visit: Payer: Medicare HMO | Admitting: Physical Therapy

## 2015-05-15 DIAGNOSIS — M25661 Stiffness of right knee, not elsewhere classified: Secondary | ICD-10-CM

## 2015-05-15 DIAGNOSIS — M25561 Pain in right knee: Secondary | ICD-10-CM

## 2015-05-15 DIAGNOSIS — R269 Unspecified abnormalities of gait and mobility: Secondary | ICD-10-CM

## 2015-05-15 NOTE — Therapy (Signed)
[facility] [address] [place], [place], [address] Phone: [phone]   Fax:  [phone]  Physical Therapy Treatment  Patient Details  Name: Gina Parker MRN: [id number] Date of Birth: Aug 20, 1941 Referring Provider:  Kathryne Hitch*  Encounter Date: 05/15/2015      PT End of Session - 05/15/15 1207    Visit Number 14   Number of Visits 18   Date for PT Re-Evaluation 05/19/15   PT Start Time 0930   PT Stop Time 1015   PT Time Calculation (min) 45 min   Activity Tolerance Patient tolerated treatment well   Behavior During Therapy [facility] for tasks assessed/performed      Past Medical History  Diagnosis Date  . Hypertension     takes Amlodipine daily  . Hyperlipidemia     doesn't take any meds  . Shortness of breath dyspnea     with exertion  . History of bronchitis     many,many yrs ago  . Headache     couple of times a week  . Brain aneurysm     takes Keppra daily  . Confusion   . Weakness     tingling in both hand  . Carpal tunnel syndrome   . Arthritis   . Joint pain   . Joint swelling   . Back pain     arthritis  . GERD (gastroesophageal reflux disease)     takes pepcid daily  . Urinary frequency   . Nocturia   . Cataracts, bilateral     immautre  . Anxiety     takes Citalpram daily  . Insomnia   . PAF (paroxysmal atrial fibrillation)     03/2014 (Dr. Yates Decamp); not felt to be a candidate for anti-coag at that time due to h/o intracranial hemorrhage '10    Past Surgical History  Procedure Laterality Date  . Brain surgery  2007    aneurysm  . Joint replacement Left   . 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 Tracheostomy  04/2009  . Total knee arthroplasty Right 12/17/2014    Procedure: RIGHT TOTAL KNEE ARTHROPLASTY;  Surgeon: Kathryne Hitch, MD;  Location: [facility] OR;  Service: Orthopedics;  Laterality: Right;    There were no vitals filed for this visit.  Visit Diagnosis:   Stiffness of right knee  Decreased ROM of right knee  Abnormality of gait  Right knee pain      Subjective Assessment - 05/15/15 0942    Subjective " I have been doing pretty good since the last visit but have had increased difficulty just before coming in today, with catching of my foot on the floor"   Currently in Pain? Yes   Pain Score 3    Pain Location Knee   Pain Orientation Right;Anterior   Pain Descriptors / Indicators Aching   Pain Type Chronic pain   Pain Onset More than a month ago   Pain Frequency Intermittent   Aggravating Factors  weather, and prolonged standing   Pain Relieving Factors ice            [facility] PT Assessment - 05/15/15 0001    AROM   Right Knee Extension -11   Right Knee Flexion 115  118 following manual                     [facility] Adult PT Treatment/Exercise - 05/15/15 0944    Lumbar Exercises: Aerobic   Stationary Bike NuStep L5  x 10 min   Knee/Hip Exercises: Stretches   Passive Hamstring Stretch 2 reps;30 seconds   Gastroc Stretch 1 rep;30 seconds   Soleus Stretch 1 rep;30 seconds   Knee/Hip Exercises: Machines for Strengthening   Cybex Knee Extension 1 plate x 10   Cybex Knee Flexion 30# 1 x 8, 45# 1x 8   Knee/Hip Exercises: Standing   Terminal Knee Extension AROM;Strengthening;Right;1 set;15 reps;Theraband   Theraband Level (Terminal Knee Extension) Level 3 (Green)   Forward Step Up Right;Step Height: 8";Hand Hold: 1;1 set;15 reps   Step Down Right;Step Height: 4";2 sets;10 reps   Wall Squat 10 reps;5 seconds;1 set   Knee/Hip Exercises: Supine   Quad Sets AROM;Strengthening;Right;15 reps;1 set  ball underneath knee to facilitate better quad contraction   Straight Leg Raises AROM;Strengthening;Right;10 reps;1 set  3#   Manual Therapy   Joint Mobilization  knee A<>P grade 4 mobs in supine                PT Education - 05/15/15 1206    Education provided Yes   Education Details HEP review, educationg  regarding possible discharge next visit   Person(s) Educated Patient   Methods Explanation   Comprehension Verbalized understanding          PT Short Term Goals - 04/21/15 1233    PT SHORT TERM GOAL #1   Title She will be I with basic HEP (03/27/2015)   Time 3   Period Weeks   Status Achieved   PT SHORT TERM GOAL #2   Title She will increase her FOTO score by > 8 points to assist with increased functional capacity (03/27/2015)   Time 3   Period Weeks   Status Achieved   PT SHORT TERM GOAL #3   Title She will be able to verbalize and demonstrate techniques to reduce R knee inflammation via RICE method (03/27/2015)   Time 3   Period Weeks   Status Achieved           PT Long Term Goals - 05/13/15 1022    PT LONG TERM GOAL #1   Title pt will be I with advanced HEP given last visit (04/17/2015)   Time 6   Period Weeks   Status On-going   PT LONG TERM GOAL #2   Title She will increase R knee AROM to > 120 degrees to assist with funcitonal and efficent gait (04/17/2015)   Time 6   Period Weeks   Status On-going   PT LONG TERM GOAL #3   Title She will decrease her 10 MWT by > 4 seconds to assist with funcitonal and safe community ambulation with LRAD (04/17/2015)   Baseline 17 seconds   Time 6   Period Weeks   Status On-going   PT LONG TERM GOAL #4   Title She will demonstrate < 2/10 pain during weight bearing activities for > 30  minutes and ascend/descend > 10 steps to assist with community ambulation and grocery shopping/ ADLs(04/17/2015   Time 6   Period Weeks   Status On-going   PT LONG TERM GOAL #5   Title She will increase her FOTO score to > 50 points to help with increased functional capacity upon discharge (04/17/2015)   Time 6   Period Weeks   Status Achieved               Plan - 05/15/15 1207    Clinical Impression Statement Hasset presents to therapy today with report that she  has been doing well since the last visit, but states that she had some  difficulty walking in to the clinic this morning. She has improved her AROM to -12 to 118, and increased strength in the R knee. She was able to perform all exercises without compliant.  Educated pt that she has made good progress and that we may have exhausted the amount of progress we will get with therapy, and that we may discharge next visit and to bring in her HEP for review.    PT Next Visit Plan FOTO, HEP review, review goals, possibly D/C   PT Home Exercise Plan HEP review   Consulted and Agree with Plan of Care Patient;Family member/caregiver   Family Member Consulted "neice"        Problem List Patient Active Problem List   Diagnosis Date Noted  . Osteoarthritis of right knee 12/17/2014  . Status post total right knee replacement 12/17/2014  . POSITIVE TB SKIN TEST, WITHOUT TUBERCULOSIS 05/23/2009  . HYPERLIPIDEMIA 05/12/2009  . ANEMIA 05/12/2009  . HYPERTENSION 05/12/2009  . INTRACEREBRAL HEMORRHAGE 05/12/2009  . CVA WITH RIGHT HEMIPARESIS 05/12/2009  . DEGENERATIVE JOINT DISEASE 05/12/2009  . OSTEOPOROSIS 05/12/2009   Lulu Riding PT, DPT, LAT, ATC  05/15/2015  12:11 PM     [facility] [address] [place], [place], [address] Phone: [phone]   Fax:  [phone]

## 2015-05-20 ENCOUNTER — Ambulatory Visit: Payer: Medicare HMO | Admitting: Physical Therapy

## 2015-05-22 ENCOUNTER — Ambulatory Visit: Payer: Medicare HMO | Admitting: Physical Therapy

## 2015-05-22 DIAGNOSIS — R269 Unspecified abnormalities of gait and mobility: Secondary | ICD-10-CM

## 2015-05-22 DIAGNOSIS — M25661 Stiffness of right knee, not elsewhere classified: Secondary | ICD-10-CM

## 2015-05-22 DIAGNOSIS — M25561 Pain in right knee: Secondary | ICD-10-CM

## 2015-05-22 NOTE — Therapy (Signed)
[place], [place], [address] Phone: [phone]   Fax:  [phone]  Physical Therapy Treatment  Patient Details  Name: Gina Parker MRN: [id number] Date of Birth: 14-Jul-1941 Referring Provider:  Mcarthur Rossetti*  Encounter Date: 05/22/2015      PT End of Session - 05/22/15 1043    Visit Number 15   Number of Visits 18   PT Start Time 1015   PT Stop Time 1100   PT Time Calculation (min) 45 min   Activity Tolerance Patient tolerated treatment well   Behavior During Therapy [facility] for tasks assessed/performed      Past Medical History  Diagnosis Date  . Hypertension     takes Amlodipine daily  . Hyperlipidemia     doesn't take any meds  . Shortness of breath dyspnea     with exertion  . History of bronchitis     many,many yrs ago  . Headache     couple of times a week  . Brain aneurysm     takes Keppra daily  . Confusion   . Weakness     tingling in both hand  . Carpal tunnel syndrome   . Arthritis   . Joint pain   . Joint swelling   . Back pain     arthritis  . GERD (gastroesophageal reflux disease)     takes pepcid daily  . Urinary frequency   . Nocturia   . Cataracts, bilateral     immautre  . Anxiety     takes Citalpram daily  . Insomnia   . PAF (paroxysmal atrial fibrillation)     03/2014 (Dr. Adrian Prows); not felt to be a candidate for anti-coag at that time due to h/o intracranial hemorrhage '10    Past Surgical History  Procedure Laterality Date  . Brain surgery  2007    aneurysm  . Joint replacement Left   . 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 Tracheostomy  04/2009  . Total knee arthroplasty Right 12/17/2014    Procedure: RIGHT TOTAL KNEE ARTHROPLASTY;  Surgeon: Mcarthur Rossetti, MD;  Location: [place];  Service: Orthopedics;  Laterality: Right;    There were no vitals filed for this visit.  Visit Diagnosis:  Stiffness of right knee  Decreased ROM  of right knee  Abnormality of gait  Right knee pain      Subjective Assessment - 05/22/15 1026    Subjective " I have been doing my exercises somewhat, and haven't been tripping or having anything problems"   Currently in Pain? Yes   Pain Score 1    Pain Location Knee   Pain Orientation Right;Anterior   Pain Descriptors / Indicators Sore   Pain Type Chronic pain   Pain Onset More than a month ago   Pain Frequency Rarely            [facility] PT Assessment - 05/22/15 1029    AROM   Right Knee Extension -6   Right Knee Flexion 118   Strength   Right Knee Flexion 4/5   Right Knee Extension 4+/5   Standardized Balance Assessment   Standardized Balance Assessment 10 meter walk test   10 Meter Walk 12 seconds  .83 m/s                     [facility] Adult PT Treatment/Exercise - 05/22/15 0001    Knee/Hip Exercises: Stretches   Active Hamstring Stretch 3  reps;30 seconds   Gastroc Stretch 1 rep;30 seconds   Soleus Stretch 1 rep;30 seconds   Knee/Hip Exercises: Aerobic   Stationary Bike L2 x 10 min   Knee/Hip Exercises: Machines for Strengthening   Cybex Knee Extension 1 plate x 10   Cybex Knee Flexion 30# 1 x 8, 45# 1x 8                PT Education - 05/22/15 1043    Education provided Yes   Education Details HEP review   Person(s) Educated Patient   Methods Explanation   Comprehension Verbalized understanding          PT Short Term Goals - 05/22/15 1031    PT SHORT TERM GOAL #1   Title She will be I with basic HEP (03/27/2015)   Time 3   Period Weeks   Status Achieved   PT SHORT TERM GOAL #2   Title She will increase her FOTO score by > 8 points to assist with increased functional capacity (03/27/2015)   Time 3   Period Weeks   Status Achieved   PT SHORT TERM GOAL #3   Title She will be able to verbalize and demonstrate techniques to reduce R knee inflammation via RICE method (03/27/2015)   Time 3   Period Weeks   Status Achieved            PT Long Term Goals - 05/22/15 1032    PT LONG TERM GOAL #1   Title pt will be I with advanced HEP given last visit (04/17/2015)   Time 6   Period Weeks   Status Achieved   PT LONG TERM GOAL #2   Title She will increase R knee AROM to > 120 degrees to assist with funcitonal and efficent gait (04/17/2015)   Baseline 118 flexion   Time 6   Period Weeks   Status Partially Met   PT LONG TERM GOAL #3   Title She will decrease her 10 MWT by > 4 seconds to assist with funcitonal and safe community ambulation with LRAD (04/17/2015)   Baseline 12 secs at discharge   Time 6   Period Weeks   Status On-going   PT LONG TERM GOAL #4   Title She will demonstrate < 2/10 pain during weight bearing activities for > 30  minutes and ascend/descend > 10 steps to assist with community ambulation and grocery shopping/ ADLs(04/17/2015   Time 6   Period Weeks   Status Achieved   PT LONG TERM GOAL #5   Title She will increase her FOTO score to > 50 points to help with increased functional capacity upon discharge (04/17/2015)   Time 6   Period Weeks   Status Achieved               Plan - 05/22/15 1044    Clinical Impression Statement Aldonia has made great progress with imrpoved AROM to -6 to 118 and increased strength in the R knee. She met all goals except for partially meeting LTG #2.  additionally she reports only having intermittent pain which she stated may be more muscle soreness rated at a 1/10. She also decreased her 62 M walk time to 12 secs putting her at .32 M/s demonstrating a community ambulator status. She is able to maintain and progress her current level  of function independently and no longer requires physical therapy. pt  will be discharged to and she agrees with this assessment.    PT Next  Visit Plan Discharged   PT Home Exercise Plan HEP review   Consulted and Agree with Plan of Care Patient;Family member/caregiver   Family Member Consulted "neice"          G-Codes -  06/18/15 1047    Functional Assessment Tool Used FOTO 46% limited   Functional Limitation Mobility: Walking and moving around   Mobility: Walking and Moving Around Goal Status [phone]) At least 40 percent but less than 60 percent impaired, limited or restricted   Mobility: Walking and Moving Around Discharge Status [phone]) At least 40 percent but less than 60 percent impaired, limited or restricted      Problem List Patient Active Problem List   Diagnosis Date Noted  . Osteoarthritis of right knee 12/17/2014  . Status post total right knee replacement 12/17/2014  . POSITIVE TB SKIN TEST, WITHOUT TUBERCULOSIS 05/23/2009  . HYPERLIPIDEMIA 05/12/2009  . ANEMIA 05/12/2009  . HYPERTENSION 05/12/2009  . INTRACEREBRAL HEMORRHAGE 05/12/2009  . CVA WITH RIGHT HEMIPARESIS 05/12/2009  . DEGENERATIVE JOINT DISEASE 05/12/2009  . OSTEOPOROSIS 05/12/2009   Starr Lake PT, DPT, LAT, ATC  June 18, 2015  10:52 AM    [facility] [address] [place], [place], [address] Phone: [phone]   Fax:  [phone]                          PHYSICAL THERAPY DISCHARGE SUMMARY  Visits from Start of Care: 15  Current functional level related to goals / functional outcomes: FOTO 46% limited   Remaining deficits: Mild weakness of R hamstring, AROM of R knee of -6 to 118   Education / Equipment: HEP, theraband for exercise  Plan: Patient agrees to discharge.  Patient goals were partially met. Patient is being discharged due to being pleased with the current functional level.  ?????

## 2015-07-31 ENCOUNTER — Other Ambulatory Visit: Payer: Self-pay | Admitting: Internal Medicine

## 2015-07-31 DIAGNOSIS — Z1231 Encounter for screening mammogram for malignant neoplasm of breast: Secondary | ICD-10-CM

## 2015-08-14 ENCOUNTER — Ambulatory Visit: Payer: Medicare HMO

## 2015-08-15 ENCOUNTER — Ambulatory Visit
Admission: RE | Admit: 2015-08-15 | Discharge: 2015-08-15 | Disposition: A | Discharge status: Home / Self Care | Payer: Commercial Managed Care - HMO | Source: Ambulatory Visit | Attending: Internal Medicine | Admitting: Internal Medicine

## 2015-08-15 DIAGNOSIS — Z1231 Encounter for screening mammogram for malignant neoplasm of breast: Secondary | ICD-10-CM

## 2015-08-19 ENCOUNTER — Other Ambulatory Visit: Payer: Self-pay | Admitting: Internal Medicine

## 2015-08-19 DIAGNOSIS — R928 Other abnormal and inconclusive findings on diagnostic imaging of breast: Secondary | ICD-10-CM

## 2015-11-05 ENCOUNTER — Other Ambulatory Visit: Payer: Self-pay | Admitting: Nurse Practitioner

## 2016-01-07 ENCOUNTER — Other Ambulatory Visit: Payer: Self-pay | Admitting: Nurse Practitioner

## 2016-01-16 ENCOUNTER — Other Ambulatory Visit: Payer: Self-pay | Admitting: Nurse Practitioner

## 2016-02-17 ENCOUNTER — Other Ambulatory Visit: Payer: Self-pay | Admitting: Nurse Practitioner
